# Patient Record
Sex: Male | Born: 1940 | Race: White | Hispanic: No | Marital: Married | Smoking: Former smoker
Health system: Southern US, Community
[De-identification: ages and names within clinical notes are randomized; demographics above are authoritative.]

## PROBLEM LIST (undated history)

## (undated) DIAGNOSIS — IMO0001 Reserved for inherently not codable concepts without codable children: Secondary | ICD-10-CM

## (undated) DIAGNOSIS — C3411 Malignant neoplasm of upper lobe, right bronchus or lung: Secondary | ICD-10-CM

## (undated) DIAGNOSIS — J189 Pneumonia, unspecified organism: Secondary | ICD-10-CM

## (undated) DIAGNOSIS — E7800 Pure hypercholesterolemia, unspecified: Secondary | ICD-10-CM

## (undated) DIAGNOSIS — I10 Essential (primary) hypertension: Secondary | ICD-10-CM

## (undated) HISTORY — DX: Malignant neoplasm of upper lobe, right bronchus or lung: C34.11

## (undated) HISTORY — PX: EYE SURGERY: SHX253

---

## 2004-11-01 ENCOUNTER — Ambulatory Visit: Payer: Self-pay | Admitting: Family Medicine

## 2004-12-27 ENCOUNTER — Ambulatory Visit: Payer: Self-pay | Admitting: Family Medicine

## 2005-05-03 ENCOUNTER — Ambulatory Visit: Payer: Self-pay | Admitting: Family Medicine

## 2005-09-07 ENCOUNTER — Ambulatory Visit: Payer: Self-pay | Admitting: Family Medicine

## 2005-09-28 ENCOUNTER — Ambulatory Visit: Payer: Self-pay | Admitting: Family Medicine

## 2005-10-11 ENCOUNTER — Ambulatory Visit: Payer: Self-pay | Admitting: Family Medicine

## 2005-12-11 ENCOUNTER — Ambulatory Visit: Payer: Self-pay | Admitting: Family Medicine

## 2006-03-19 ENCOUNTER — Ambulatory Visit: Payer: Self-pay | Admitting: Family Medicine

## 2006-07-23 ENCOUNTER — Ambulatory Visit: Payer: Self-pay | Admitting: Family Medicine

## 2006-12-05 ENCOUNTER — Ambulatory Visit: Payer: Self-pay | Admitting: Family Medicine

## 2008-09-29 ENCOUNTER — Ambulatory Visit (HOSPITAL_COMMUNITY): Admission: RE | Admit: 2008-09-29 | Discharge: 2008-09-29 | Payer: Self-pay | Admitting: Ophthalmology

## 2011-07-14 LAB — CBC
HCT: 50.1 % (ref 39.0–52.0)
Hemoglobin: 16.9 g/dL (ref 13.0–17.0)
MCV: 95.2 fL (ref 78.0–100.0)
WBC: 9.6 10*3/uL (ref 4.0–10.5)

## 2011-11-14 ENCOUNTER — Encounter (HOSPITAL_COMMUNITY): Payer: Self-pay | Admitting: Pharmacy Technician

## 2011-11-21 ENCOUNTER — Ambulatory Visit (HOSPITAL_COMMUNITY)
Admission: RE | Admit: 2011-11-21 | Discharge: 2011-11-21 | Disposition: A | Discharge status: Home / Self Care | Payer: Medicare Other | Source: Ambulatory Visit | Attending: Ophthalmology | Admitting: Ophthalmology

## 2011-11-21 ENCOUNTER — Encounter (HOSPITAL_COMMUNITY)
Admission: RE | Disposition: A | Discharge status: Home / Self Care | Payer: Self-pay | Source: Ambulatory Visit | Attending: Ophthalmology

## 2011-11-21 DIAGNOSIS — I10 Essential (primary) hypertension: Secondary | ICD-10-CM | POA: Insufficient documentation

## 2011-11-21 DIAGNOSIS — H26499 Other secondary cataract, unspecified eye: Secondary | ICD-10-CM | POA: Insufficient documentation

## 2011-11-21 DIAGNOSIS — E7800 Pure hypercholesterolemia, unspecified: Secondary | ICD-10-CM | POA: Insufficient documentation

## 2011-11-21 SURGERY — TREATMENT, USING YAG LASER
Anesthesia: LOCAL | Laterality: Right

## 2011-11-21 MED ORDER — CYCLOPENTOLATE-PHENYLEPHRINE 0.2-1 % OP SOLN
1.0000 [drp] | OPHTHALMIC | Status: AC
  Administered 2011-11-21 (×3): 1 [drp] via OPHTHALMIC

## 2011-11-21 MED ORDER — CYCLOPENTOLATE-PHENYLEPHRINE 0.2-1 % OP SOLN
OPHTHALMIC | Status: AC
  Administered 2011-11-21: 1 [drp] via OPHTHALMIC
  Filled 2011-11-21: qty 2

## 2011-11-21 NOTE — Discharge Instructions (Signed)
2 JAICION LAURIE  11/21/2011     Instructions    Activity: No Restrictions.   Diet: Resume Diet you were on at home.   Pain Medication: Tylenol if Needed.   CONTACT YOUR DOCTOR IF YOU HAVE PAIN, REDNESS IN YOUR EYE, OR DECREASED VISION.   Follow-up:today  Between 1 and 2 pm with Loraine Leriche T. Nile Riggs, MD.   Dr. Lahoma Crocker: [phone]  Dr. Lita Mains: [phone]  Dr. Alto Denver: [phone]   If you find that you cannot contact your physician, but feel that your signs and   Symptoms warrant a physician's attention, call the Emergency Room at   [phone] [phone].   Othern/a.

## 2011-11-21 NOTE — H&P (Signed)
The patient was re examined and there is no change in the patients condition since the original H and P. 

## 2011-11-21 NOTE — Brief Op Note (Signed)
Dustin Bradshaw 11/21/2011  [name] T. Nile Riggs, MD  Yag Laser Self Test Completedyes. Procedure: Posterior Capsulotomy, right eye.  Eye Protection Worn by Staff yes. Laser In Use Sign on Door yes.  Laser: Nd:YAG Spot Size: Fixed Burst Mode: III Power Setting: 3.4 mJ/burst  Number of shots: 19 Total energy delivered: 63.6 mJ  Patency of the peripheral iridotomy was confirmed visually.  The patient tolerated the procedure without difficulty. No complications were encountered.    The patient was discharged home with the instructions to continue all his current glaucoma medications, if any.   Patient instructed to go to office at 0100 for intraocular pressure check.  Patient verbalizes understanding of discharge instructions yes.

## 2014-03-08 ENCOUNTER — Emergency Department (HOSPITAL_COMMUNITY)
Admission: EM | Admit: 2014-03-08 | Discharge: 2014-03-08 | Disposition: A | Discharge status: Home / Self Care | Payer: Medicare HMO | Attending: Emergency Medicine | Admitting: Emergency Medicine

## 2014-03-08 ENCOUNTER — Emergency Department (HOSPITAL_COMMUNITY): Payer: Medicare HMO

## 2014-03-08 ENCOUNTER — Encounter (HOSPITAL_COMMUNITY): Payer: Self-pay | Admitting: Emergency Medicine

## 2014-03-08 DIAGNOSIS — E7800 Pure hypercholesterolemia, unspecified: Secondary | ICD-10-CM | POA: Insufficient documentation

## 2014-03-08 DIAGNOSIS — IMO0002 Reserved for concepts with insufficient information to code with codable children: Secondary | ICD-10-CM | POA: Insufficient documentation

## 2014-03-08 DIAGNOSIS — J40 Bronchitis, not specified as acute or chronic: Secondary | ICD-10-CM

## 2014-03-08 DIAGNOSIS — I10 Essential (primary) hypertension: Secondary | ICD-10-CM | POA: Insufficient documentation

## 2014-03-08 DIAGNOSIS — Z87891 Personal history of nicotine dependence: Secondary | ICD-10-CM | POA: Insufficient documentation

## 2014-03-08 HISTORY — DX: Pure hypercholesterolemia, unspecified: E78.00

## 2014-03-08 HISTORY — DX: Essential (primary) hypertension: I10

## 2014-03-08 MED ORDER — PREDNISONE 10 MG PO TABS: 40.0000 mg | ORAL_TABLET | Freq: Every day | ORAL | Status: DC

## 2014-03-08 MED ORDER — PREDNISONE 50 MG PO TABS
60.0000 mg | ORAL_TABLET | Freq: Once | ORAL | Status: AC
  Administered 2014-03-08: 60 mg via ORAL
  Filled 2014-03-08 (×2): qty 1

## 2014-03-08 MED ORDER — ALBUTEROL SULFATE HFA 108 (90 BASE) MCG/ACT IN AERS
2.0000 | INHALATION_SPRAY | Freq: Four times a day (QID) | RESPIRATORY_TRACT | Status: DC
  Administered 2014-03-08: 2 via RESPIRATORY_TRACT
  Filled 2014-03-08: qty 6.7

## 2014-03-08 MED ORDER — DM-GUAIFENESIN ER 30-600 MG PO TB12: 1.0000 | ORAL_TABLET | Freq: Two times a day (BID) | ORAL | Status: DC

## 2014-03-08 NOTE — Discharge Instructions (Signed)
Bronchitis Bronchitis is swelling (inflammation) of the air tubes leading to your lungs (bronchi). This causes mucus and a cough. If the swelling gets bad, you may have trouble breathing. HOME CARE   Rest.  Drink enough fluids to keep your pee (urine) clear or pale yellow (unless you have a condition where you have to watch how much you drink).  Only take medicine as told by your doctor. If you were given antibiotic medicines, finish them even if you start to feel better.  Avoid smoke, irritating chemicals, and strong smells. These make the problem worse. Quit smoking if you smoke. This helps your lungs heal faster.  Use a cool mist humidifier. Change the water in the humidifier every day. You can also sit in the bathroom with hot shower running for 5 10 minutes. Keep the door closed.  See your health care provider as told.  Wash your hands often. GET HELP IF: Your problems do not get better after 1 week. GET HELP RIGHT AWAY IF:   Your fever gets worse.  You have chills.  Your chest hurts.  Your problems breathing get worse.  You have blood in your mucus.  You pass out (faint).  You feel lightheaded.  You have a bad headache.  You throw up (vomit) again and again. MAKE SURE YOU:  Understand these instructions.  Will watch your condition.  Will get help right away if you are not doing well or get worse. Document Released: 03/13/2008 Document Revised: 07/16/2013 Document Reviewed: 05/20/2013 [facility] Patient Information 2014 [place], [place].  Use albuterol inhaler 2 puffs every 6 hours for the next 7 days. Take the Mucinex as directed for the next 7 days. Can continue that if necessary. Take prednisone for the next 5 days. Return for any new or worse symptoms. If after a week still not improved follow up with your regular Dr. Also recommend continuing the complete course of the doxycycline.

## 2014-03-08 NOTE — ED Provider Notes (Signed)
CSN: [id number]     Arrival date & time 03/08/14  1339 History   First MD Initiated Contact with Patient 03/08/14 1539     Chief Complaint  Patient presents with  . Cough     (Consider location/radiation/quality/duration/timing/severity/associated sxs/prior Treatment) Patient is a 73 y.o. male presenting with cough. The history is provided by the patient and the spouse.  Cough Associated symptoms: no chest pain, no fever, no headaches, no rash, no shortness of breath, no sore throat and no wheezing     patient with 2 week history of productive of green-colored sputum. And cough obviously. Also associated with some sinus congestion and pressure. Patient was seen in urgent care on Tuesday and diagnosed with bronchitis and sinusitis and started on doxycycline. Patient without any improvement. Patient is still a smoker still smokes occasionally. Patient denies fever. Patient denies any sniffing shortness of breath. No chest pain. Past Medical History  Diagnosis Date  . Hypertension   . High cholesterol    Past Surgical History  Procedure Laterality Date  . Eye surgery     No family history on file. History  Substance Use Topics  . Smoking status: Former Research scientist (life sciences)  . Smokeless tobacco: Not on file  . Alcohol Use: No    Review of Systems  Constitutional: Negative for fever.  HENT: Positive for congestion and sinus pressure. Negative for sore throat.   Eyes: Negative for visual disturbance.  Respiratory: Positive for cough. Negative for shortness of breath and wheezing.   Cardiovascular: Negative for chest pain.  Gastrointestinal: Negative for abdominal pain.  Genitourinary: Negative for dysuria.  Musculoskeletal: Negative for back pain.  Skin: Negative for rash.  Neurological: Negative for headaches.  Hematological: Does not bruise/bleed easily.  Psychiatric/Behavioral: Negative for confusion.      Allergies  Review of patient's allergies indicates no known allergies.  Home  Medications   Prior to Admission medications   Medication Sig Start Date End Date Taking? Authorizing Provider  amLODipine (NORVASC) 5 MG tablet Take 5 mg by mouth daily.    Historical Provider, MD  amLODipine-olmesartan (AZOR) 5-40 MG per tablet Take 1 tablet by mouth daily.    Historical Provider, MD  dextromethorphan-guaiFENesin (MUCINEX DM) 30-600 MG per 12 hr tablet Take 1 tablet by mouth 2 (two) times daily. 03/08/14   Fredia Sorrow, MD  metoprolol (TOPROL-XL) 200 MG 24 hr tablet Take 200 mg by mouth daily.    Historical Provider, MD  predniSONE (DELTASONE) 10 MG tablet Take 4 tablets (40 mg total) by mouth daily. 03/08/14   Fredia Sorrow, MD  rosuvastatin (CRESTOR) 20 MG tablet Take 20 mg by mouth at bedtime.    Historical Provider, MD   BP 155/94  Pulse 60  Temp(Src) 98.1 F (36.7 C) (Oral)  Resp 20  Ht 5\' 6"  (1.676 m)  Wt 202 lb (91.627 kg)  BMI 32.62 kg/m2  SpO2 96% Physical Exam  Nursing note and vitals reviewed. Constitutional: He is oriented to person, place, and time. He appears well-developed and well-nourished. No distress.  HENT:  Head: Normocephalic and atraumatic.  Mouth/Throat: Oropharynx is clear and moist.  Eyes: Conjunctivae are normal. Pupils are equal, round, and reactive to light.  Neck: Normal range of motion. Neck supple.  Cardiovascular: Normal rate, regular rhythm and normal heart sounds.   No murmur heard. Pulmonary/Chest: Effort normal. No respiratory distress. He has no wheezes. He has no rales.  Abdominal: Soft. Bowel sounds are normal. There is no tenderness.  Musculoskeletal: Normal range of  motion.  Neurological: He is alert and oriented to person, place, and time. No cranial nerve deficit. He exhibits normal muscle tone. Coordination normal.  Skin: Skin is warm. No rash noted.    ED Course  Procedures (including critical care time) Labs Review Labs Reviewed - No data to display  Imaging Review Dg Chest 2 View  03/08/2014   CLINICAL  DATA:  Cough and congestion  EXAM: CHEST  2 VIEW  COMPARISON:  03/03/2014  FINDINGS: The heart size and mediastinal contours are within normal limits. Both lungs are clear. The visualized skeletal structures are unremarkable.  IMPRESSION: No active cardiopulmonary disease.   Electronically Signed   By: Inez Catalina M.D.   On: 03/08/2014 14:39     EKG Interpretation None      MDM   Final diagnoses:  Bronchitis    Patient nontoxic no acute distress. No hypoxia. Symptoms most likely consistent with a bronchitis could be a component of COPD patient is a smoker as well could be a viral component. Because there is some sinus pressure. No distinct upper respiratory infection type symptoms early on though. We'll treat with albuterol inhaler 2 puffs every 6 hours prednisone for the next 5 days. Mucinex DM for the next 7 days. And have patient complete his course of doxycycline that started Tuesday from the urgent care. Today's chest x-ray is negative for pneumonia.    Fredia Sorrow, MD 03/08/14 [phone]

## 2014-03-08 NOTE — ED Notes (Signed)
Pt alert & oriented x4, stable gait. Patient given discharge instructions, paperwork & prescription(s). Patient  instructed to stop at the registration desk to finish any additional paperwork. Patient verbalized understanding. Pt left department w/ no further questions. 

## 2014-03-08 NOTE — ED Notes (Signed)
Pt c/o cough that is productive with green/cream sputum production that started two weeks ago, was seen at urgent care on Tuesday, dx with bronchitis and sinusitis, prescribed doxycycline but states that he is not getting any better,

## 2015-06-02 ENCOUNTER — Encounter (HOSPITAL_COMMUNITY): Payer: Self-pay | Admitting: Cardiology

## 2015-06-02 ENCOUNTER — Emergency Department (HOSPITAL_COMMUNITY): Payer: Medicare HMO

## 2015-06-02 ENCOUNTER — Emergency Department (HOSPITAL_COMMUNITY)
Admission: EM | Admit: 2015-06-02 | Discharge: 2015-06-02 | Disposition: A | Discharge status: Home / Self Care | Payer: Medicare HMO | Attending: Emergency Medicine | Admitting: Emergency Medicine

## 2015-06-02 DIAGNOSIS — Z79899 Other long term (current) drug therapy: Secondary | ICD-10-CM | POA: Diagnosis not present

## 2015-06-02 DIAGNOSIS — K567 Ileus, unspecified: Secondary | ICD-10-CM | POA: Diagnosis not present

## 2015-06-02 DIAGNOSIS — E780 Pure hypercholesterolemia: Secondary | ICD-10-CM | POA: Insufficient documentation

## 2015-06-02 DIAGNOSIS — J159 Unspecified bacterial pneumonia: Secondary | ICD-10-CM | POA: Insufficient documentation

## 2015-06-02 DIAGNOSIS — Z87891 Personal history of nicotine dependence: Secondary | ICD-10-CM | POA: Diagnosis not present

## 2015-06-02 DIAGNOSIS — I10 Essential (primary) hypertension: Secondary | ICD-10-CM | POA: Insufficient documentation

## 2015-06-02 DIAGNOSIS — J189 Pneumonia, unspecified organism: Secondary | ICD-10-CM

## 2015-06-02 DIAGNOSIS — R51 Headache: Secondary | ICD-10-CM | POA: Diagnosis present

## 2015-06-02 LAB — COMPREHENSIVE METABOLIC PANEL
ALBUMIN: 3.2 g/dL — AB (ref 3.5–5.0)
ALK PHOS: 89 U/L (ref 38–126)
ALT: 39 U/L (ref 17–63)
AST: 28 U/L (ref 15–41)
Anion gap: 8 (ref 5–15)
BILIRUBIN TOTAL: 1.2 mg/dL (ref 0.3–1.2)
BUN: 16 mg/dL (ref 6–20)
CALCIUM: 8.7 mg/dL — AB (ref 8.9–10.3)
CO2: 29 mmol/L (ref 22–32)
CREATININE: 1.13 mg/dL (ref 0.61–1.24)
Chloride: 104 mmol/L (ref 101–111)
GFR calc Af Amer: >60 mL/min (ref >60)
GFR calc non Af Amer: >60 mL/min (ref >60)
GLUCOSE: 108 mg/dL — AB (ref 65–99)
Potassium: 4.2 mmol/L (ref 3.5–5.1)
SODIUM: 141 mmol/L (ref 135–145)
TOTAL PROTEIN: 6.6 g/dL (ref 6.5–8.1)

## 2015-06-02 LAB — URINALYSIS, ROUTINE W REFLEX MICROSCOPIC
BILIRUBIN URINE: NEGATIVE
GLUCOSE, UA: NEGATIVE mg/dL
Ketones, ur: NEGATIVE mg/dL
Leukocytes, UA: NEGATIVE
Nitrite: NEGATIVE
PH: 6 (ref 5.0–8.0)
Protein, ur: 30 mg/dL — AB
SPECIFIC GRAVITY, URINE: 1.02 (ref 1.005–1.030)
Urobilinogen, UA: 0.2 mg/dL (ref 0.0–1.0)

## 2015-06-02 LAB — CBC WITH DIFFERENTIAL/PLATELET
BASOS PCT: 0 % (ref 0–1)
Basophils Absolute: 0 10*3/uL (ref 0.0–0.1)
EOS ABS: 0 10*3/uL (ref 0.0–0.7)
Eosinophils Relative: 0 % (ref 0–5)
HEMATOCRIT: 42.4 % (ref 39.0–52.0)
HEMOGLOBIN: 14.1 g/dL (ref 13.0–17.0)
LYMPHS ABS: 2.1 10*3/uL (ref 0.7–4.0)
Lymphocytes Relative: 13 % (ref 12–46)
MCH: 32.1 pg (ref 26.0–34.0)
MCHC: 33.3 g/dL (ref 30.0–36.0)
MCV: 96.6 fL (ref 78.0–100.0)
MONOS PCT: 10 % (ref 3–12)
Monocytes Absolute: 1.7 10*3/uL — ABNORMAL HIGH (ref 0.1–1.0)
NEUTROS ABS: 12.8 10*3/uL — AB (ref 1.7–7.7)
NEUTROS PCT: 77 % (ref 43–77)
Platelets: 283 10*3/uL (ref 150–400)
RBC: 4.39 MIL/uL (ref 4.22–5.81)
RDW: 13.4 % (ref 11.5–15.5)
WBC: 16.6 10*3/uL — AB (ref 4.0–10.5)

## 2015-06-02 LAB — URINE MICROSCOPIC-ADD ON

## 2015-06-02 LAB — LIPASE, BLOOD: Lipase: 22 U/L (ref 22–51)

## 2015-06-02 MED ORDER — IOHEXOL 300 MG/ML  SOLN
100.0000 mL | Freq: Once | INTRAMUSCULAR | Status: AC | PRN
  Administered 2015-06-02: 100 mL via INTRAVENOUS

## 2015-06-02 MED ORDER — IOHEXOL 300 MG/ML  SOLN
50.0000 mL | Freq: Once | INTRAMUSCULAR | Status: AC | PRN
  Administered 2015-06-02: 50 mL via ORAL

## 2015-06-02 MED ORDER — LEVOFLOXACIN 500 MG PO TABS: 500.0000 mg | ORAL_TABLET | Freq: Every day | ORAL | Status: DC

## 2015-06-02 MED ORDER — LEVOFLOXACIN 750 MG PO TABS
750.0000 mg | ORAL_TABLET | Freq: Once | ORAL | Status: AC
  Administered 2015-06-02: 750 mg via ORAL
  Filled 2015-06-02: qty 1

## 2015-06-02 NOTE — Discharge Instructions (Signed)
Small frequent meals. Tylenol as needed for fever. Levaquin as prescribed for 10 days. Follow-up with your primary care physician in 3-4 weeks for repeat x-ray to ensure that the changes noted on your x-ray today have completely resolved.  Ileus The intestine (bowel, or gut) is a long, muscular tube connecting your stomach to your rectum. If the intestine stops working, food cannot pass through. This is called an ileus. This can happen for a variety of reasons. Ileus is a major medical problem that usually requires hospitalization. If your intestine stops working because of a blockage, this is called a bowel obstruction and is a different condition. CAUSES   Surgery in your abdomen. This can last from a few hours to a few days.  An infection or inflammation in the belly (abdomen). This includes inflammation of the lining of the abdomen (peritonitis).  Infection or inflammation in other parts of the body, such as pneumonia or pancreatitis.  Passage of gallstones or kidney stones.  Damage to the nerves or blood vessels which go to the bowel.  Imbalance in the salts in the blood (electrolytes).  Injury to the brain and/or spinal cord.  Medications. Many medications can cause ileus or make it worse. The most common of these are strong pain medications. SYMPTOMS  Symptoms of bowel obstruction come from the bowel inactivity. They may include:  Bloating. Your belly gets bigger (distension).  Pain or discomfort in the abdomen.  Poor appetite, feeling sick to your stomach (nausea), and vomiting.  You may also not be able to hear your normal bowel sounds, such as "growling" in your stomach. DIAGNOSIS   Your history and a physical exam will usually suggest to your caregiver that you have an ileus.  X-rays or a CT scan of your abdomen will confirm the diagnosis. X-rays, CT scans, and lab tests may also suggest the cause. TREATMENT   Rest the intestine until it starts working again.  This is most often accomplished by:  Stopping intake of oral food and drink. Dehydration is prevented by using IV (intravenous) fluids.  Sometimes, a nasogastric tube (NG tube) is needed. This is a narrow plastic tube inserted through your nose and into your stomach. It is connected to suction to keep the stomach emptied out. This also helps treat the nausea and vomiting.  If there is an imbalance in the electrolytes, they are corrected with supplements in your intravenous fluids.  Medications that might make an ileus worse might be stopped.  There are no medications that reliably treat ileus, though your caregiver may suggest a trial of certain medications.  If your condition is slow to resolve, you will be reevaluated to be sure another condition, such as a blockage, is not present. Ileus is common and usually has a good outcome. Depending on the cause of your ileus, it usually can be treated by your caregivers with good results. Sometimes, specialists (surgeons or gastroenterologists) are asked to assist in your care.  HOME CARE INSTRUCTIONS   Follow your caregiver's instructions regarding diet and fluid intake. This will usually include drinking plenty of clear fluids, avoiding alcohol and caffeine, and eating a gentle diet.  Follow your caregiver's instructions regarding activity. A period of rest is sometimes advised before returning to work or school.  Take only medications prescribed by your caregiver. Be especially careful with narcotic pain medication, which can slow your bowel activity and contribute to ileus.  Keep any follow-up appointments with your caregiver or specialists. SEEK MEDICAL CARE IF:  You have a recurrence of nausea, vomiting, or abdominal discomfort.  You develop fever of more than 102 F (38.9 C). SEEK IMMEDIATE MEDICAL CARE IF:   You have severe abdominal pain.  You are unable to keep fluids down. Document Released: 09/28/2003 Document Revised:  02/09/2014 Document Reviewed: 01/28/2009 [facility] Patient Information 2015 [place], [place]. This information is not intended to replace advice given to you by your health care provider. Make sure you discuss any questions you have with your health care provider.  Pneumonia, Adult Pneumonia is an infection of the lungs. It may be caused by a germ (virus or bacteria). Some types of pneumonia can spread easily from person to person. This can happen when you cough or sneeze. HOME CARE  Only take medicine as told by your doctor.  Take your medicine (antibiotics) as told. Finish it even if you start to feel better.  Do not smoke.  You may use a vaporizer or humidifier in your room. This can help loosen thick spit (mucus).  Sleep so you are almost sitting up (semi-upright). This helps reduce coughing.  Rest. A shot (vaccine) can help prevent pneumonia. Shots are often advised for:  People over 61 years old.  Patients on chemotherapy.  People with long-term (chronic) lung problems.  People with immune system problems. GET HELP RIGHT AWAY IF:   You are getting worse.  You cannot control your cough, and you are losing sleep.  You cough up blood.  Your pain gets worse, even with medicine.  You have a fever.  Any of your problems are getting worse, not better.  You have shortness of breath or chest pain. MAKE SURE YOU:   Understand these instructions.  Will watch your condition.  Will get help right away if you are not doing well or get worse. Document Released: 03/13/2008 Document Revised: 12/18/2011 Document Reviewed: 12/16/2010 [facility] Patient Information 2015 [place], [place]. This information is not intended to replace advice given to you by your health care provider. Make sure you discuss any questions you have with your health care provider.

## 2015-06-02 NOTE — ED Provider Notes (Signed)
CSN: [id number]     Arrival date & time 06/02/15  1705 History   First MD Initiated Contact with Patient 06/02/15 1836     Chief Complaint  Patient presents with  . Headache      HPI  Patient presents for evaluation with essentially 2 complaints.   One: He states he was riding his lawnmower and struck his head against a tree limb. It knocked him backwards against the back edge of the seat but did not knock him from the mower. He is able to continue mowing and weed needed the rest of his yard. This is over a week ago. He continues with persistent headache since that time. No bleeding from ears nose or mouth. No bruising around the head or neck.  2: He had fevers shakes and chills Sunday that again yesterday. Does not have cough or sputum production. Area some pain across the shoulders. He feels like the last few months his abdomen has become bigger. He "refused whether it is distended or if he is "just putting on weight". He has normal bowel movements. He has normal urine output. He states he feels full very easily after eating.  Past Medical History  Diagnosis Date  . Hypertension   . High cholesterol    Past Surgical History  Procedure Laterality Date  . Eye surgery     History reviewed. No pertinent family history. Social History  Substance Use Topics  . Smoking status: Former Research scientist (life sciences)  . Smokeless tobacco: None  . Alcohol Use: No    Review of Systems  Constitutional: Positive for fever, chills and diaphoresis. Negative for appetite change and fatigue.  HENT: Negative for mouth sores, sore throat and trouble swallowing.   Eyes: Negative for visual disturbance.  Respiratory: Negative for cough, chest tightness, shortness of breath and wheezing.   Cardiovascular: Negative for chest pain.  Gastrointestinal: Positive for abdominal pain and abdominal distention. Negative for nausea, vomiting and diarrhea.  Endocrine: Negative for polydipsia, polyphagia and polyuria.    Genitourinary: Negative for dysuria, frequency and hematuria.  Musculoskeletal: Negative for gait problem.  Skin: Negative for color change, pallor and rash.  Neurological: Positive for headaches. Negative for dizziness, syncope and light-headedness.  Hematological: Does not bruise/bleed easily.  Psychiatric/Behavioral: Negative for behavioral problems and confusion.      Allergies  Review of patient's allergies indicates no known allergies.  Home Medications   Prior to Admission medications   Medication Sig Start Date End Date Taking? Authorizing Provider  amLODipine (NORVASC) 5 MG tablet Take 5 mg by mouth daily.    Historical Provider, MD  amLODipine-olmesartan (AZOR) 5-40 MG per tablet Take 1 tablet by mouth daily.    Historical Provider, MD  dextromethorphan-guaiFENesin (MUCINEX DM) 30-600 MG per 12 hr tablet Take 1 tablet by mouth 2 (two) times daily. 03/08/14   Fredia Sorrow, MD  levofloxacin (LEVAQUIN) 500 MG tablet Take 1 tablet (500 mg total) by mouth daily. 06/02/15   Tanna Furry, MD  metoprolol (TOPROL-XL) 200 MG 24 hr tablet Take 200 mg by mouth daily.    Historical Provider, MD  predniSONE (DELTASONE) 10 MG tablet Take 4 tablets (40 mg total) by mouth daily. 03/08/14   Fredia Sorrow, MD  rosuvastatin (CRESTOR) 20 MG tablet Take 20 mg by mouth at bedtime.    Historical Provider, MD   BP 128/86 mmHg  Pulse 67  Temp(Src) 98 F (36.7 C) (Oral)  Resp 20  Ht '5\' 6"'$  (1.676 m)  Wt 208 lb (94.348 kg)  BMI 33.59 kg/m2  SpO2 96% Physical Exam  Constitutional: He is oriented to person, place, and time. He appears well-developed and well-nourished. No distress.  HENT:  Head: Normocephalic. Head is without raccoon's eyes, without Battle's sign, without abrasion and without contusion. Hair is normal.  No sign of trauma to the head. No bruising or abrasions. No blood over the TMs, mastoids, or your nose or mouth. Nontender in the midline neck and spine.  Eyes: Conjunctivae are  normal. Pupils are equal, round, and reactive to light. No scleral icterus.  Neck: Normal range of motion. Neck supple. No thyromegaly present.  Cardiovascular: Normal rate and regular rhythm.  Exam reveals no gallop and no friction rub.   No murmur heard. Pulmonary/Chest: Effort normal and breath sounds normal. No respiratory distress. He has no wheezes. He has no rales.    Abdominal: Soft. Bowel sounds are normal. He exhibits no distension. There is no tenderness. There is no rebound.  Soft but protuberant abdomen. Normal active bowel sounds.  Musculoskeletal: Normal range of motion.  Neurological: He is alert and oriented to person, place, and time.  Skin: Skin is warm and dry. No rash noted.  Psychiatric: He has a normal mood and affect. His behavior is normal.    ED Course  Procedures (including critical care time) Labs Review Labs Reviewed  CBC WITH DIFFERENTIAL/PLATELET - Abnormal; Notable for the following:    WBC 16.6 (*)    Neutro Abs 12.8 (*)    Monocytes Absolute 1.7 (*)    All other components within normal limits  COMPREHENSIVE METABOLIC PANEL - Abnormal; Notable for the following:    Glucose, Bld 108 (*)    Calcium 8.7 (*)    Albumin 3.2 (*)    All other components within normal limits  URINALYSIS, ROUTINE W REFLEX MICROSCOPIC (NOT AT [facility]) - Abnormal; Notable for the following:    Hgb urine dipstick SMALL (*)    Protein, ur 30 (*)    All other components within normal limits  URINE MICROSCOPIC-ADD ON - Abnormal; Notable for the following:    Squamous Epithelial / LPF FEW (*)    All other components within normal limits  LIPASE, BLOOD    Imaging Review Dg Chest 2 View  06/02/2015   CLINICAL DATA:  Headache since last Thursday. Ran into a tree limb. Chills and abdominal bloating.  EXAM: CHEST  2 VIEW  COMPARISON:  03/08/2014  FINDINGS: Airspace disease demonstrated in the right upper lung most likely to represent pneumonia. Diffuse hyperinflation. Left lung is  clear. Heart size and pulmonary vascularity are normal. No pneumothorax. Degenerative changes in the spine.  IMPRESSION: Airspace disease in the right upper lung likely representing pneumonia. Followup PA and lateral chest X-ray is recommended in 3-4 weeks following trial of antibiotic therapy to ensure resolution and exclude underlying malignancy.   Electronically Signed   By: Lucienne Capers M.D.   On: 06/02/2015 20:36   Ct Head Wo Contrast  06/02/2015   CLINICAL DATA:  Struck head on a tree branch while riding a lawn mower week ago. Headache.  EXAM: CT HEAD WITHOUT CONTRAST  TECHNIQUE: Contiguous axial images were obtained from the base of the skull through the vertex without intravenous contrast.  COMPARISON:  None.  FINDINGS: There is mild age related volume loss. No evidence of old or acute focal infarction, mass lesion, hemorrhage, hydrocephalus or extra-axial collection. No skull fracture. No fluid in the sinuses, middle ears or mastoids.  IMPRESSION: No acute or traumatic  finding.  Mild generalized brain atrophy.   Electronically Signed   By: Nelson Chimes M.D.   On: 06/02/2015 20:28   Ct Abdomen Pelvis W Contrast  06/02/2015   CLINICAL DATA:  Abdominal distension, chills for couple of days  EXAM: CT ABDOMEN AND PELVIS WITH CONTRAST  TECHNIQUE: Multidetector CT imaging of the abdomen and pelvis was performed using the standard protocol following bolus administration of intravenous contrast.  CONTRAST:  16m OMNIPAQUE IOHEXOL 300 MG/ML SOLN, 1021mOMNIPAQUE IOHEXOL 300 MG/ML SOLN  COMPARISON:  None.  FINDINGS: Lower chest: Trace right pleural effusion. Lung bases are otherwise clear. Normal heart size.  Hepatobiliary: 5.9 x 7 cm hypodense, fluid attenuating right hepatic mass consistent with a cyst. Smaller 4.5 x 4.1 cm hypodense, fluid attenuating right hepatic mass most consistent with a cyst. 5.2 x 6.2 cm hypodense, fluid attenuating left hepatic mass most consistent with a cyst. Multiple other  hypodensities throughout the liver which are too small to characterize, but likely represent small cysts. Gallbladder is decompressed. No intrahepatic or extrahepatic biliary ductal dilatation.  Pancreas: Normal.  Spleen: Normal.  Adrenals/Urinary Tract: Normal adrenal glands. Multiple hypodense, fluid attenuating left renal mass is with the largest measuring 3.5 cm 9.9 x 8.3 cm in the lower pole most consistent with cysts. Two hypodense, fluid attenuating right inferior pole renal masses abutting each other measuring 3.6 x 2.9 cm and 2.9 x 3.7 cm respectively most consistent with cysts. There is a 5 mm calculus in the left bladder base adjacent to the UVJ. No obstructive uropathy.  Stomach/Bowel: No bowel wall thickening or dilatation. No pneumatosis, pneumoperitoneum or portal venous gas. No abdominal or pelvic free fluid.  Vascular/Lymphatic: Normal caliber abdominal aorta with atherosclerosis. No abdominal or pelvic lymphadenopathy.  Other: There is prostatic enlargement. No fluid collection or hematoma.  Musculoskeletal: No acute osseous abnormality. No aggressive lytic or sclerotic osseous lesion. Small sclerotic lesion in the right pubic symphysis likely reflecting a bone island. Bilateral facet arthropathy at L3-4, L4-5 and L5-S1.  IMPRESSION: 1. There is a 5 mm calculus in the left bladder base adjacent to the UVJ. No obstructive uropathy. 2. Multiple bilateral renal cysts. 3. Multiple hepatic cysts. 4. Trace right pleural effusion.   Electronically Signed   By: HeKathreen Devoid On: 06/02/2015 20:35   I have personally reviewed and evaluated these images and lab results as part of my medical decision-making.   EKG Interpretation None      MDM   Final diagnoses:  CAP (community acquired pneumonia)  Ileus    Head is atraumatic. Abdomen shows no acute findings. Has right upper lobe pneumonia. He likely has ileus secondary to his acute infection. He is tolerating by mouth here. Does not appear  acutely ill. Pain is Levaquin, small frequent meals. Stay hydrated. When necessary Tylenol. Primary care follow-up.    MaTanna FurryMD 06/02/15 2328

## 2015-06-02 NOTE — ED Notes (Signed)
Headache since last Thursday.  States he ran into a tree limb.  Also c/o chills.

## 2015-06-02 NOTE — ED Notes (Signed)
Discharge instructions given, pt demonstrated teach back and verbal understanding. No concerns voiced.  

## 2015-08-24 ENCOUNTER — Encounter (HOSPITAL_COMMUNITY)
Admission: RE | Disposition: A | Discharge status: Home / Self Care | Payer: Self-pay | Source: Ambulatory Visit | Attending: Ophthalmology

## 2015-08-24 ENCOUNTER — Ambulatory Visit (HOSPITAL_COMMUNITY)
Admission: RE | Admit: 2015-08-24 | Discharge: 2015-08-24 | Disposition: A | Discharge status: Home / Self Care | Payer: Medicare HMO | Source: Ambulatory Visit | Attending: Ophthalmology | Admitting: Ophthalmology

## 2015-08-24 DIAGNOSIS — I10 Essential (primary) hypertension: Secondary | ICD-10-CM | POA: Insufficient documentation

## 2015-08-24 DIAGNOSIS — H26492 Other secondary cataract, left eye: Secondary | ICD-10-CM | POA: Diagnosis present

## 2015-08-24 HISTORY — PX: YAG LASER APPLICATION: SHX6189

## 2015-08-24 SURGERY — TREATMENT, USING YAG LASER
Anesthesia: LOCAL | Laterality: Left

## 2015-08-24 MED ORDER — TROPICAMIDE 1 % OP SOLN
1.0000 [drp] | OPHTHALMIC | Status: AC
  Administered 2015-08-24 (×3): 1 [drp] via OPHTHALMIC

## 2015-08-24 MED ORDER — TROPICAMIDE 1 % OP SOLN
OPHTHALMIC | Status: AC
  Filled 2015-08-24: qty 3

## 2015-08-24 NOTE — Discharge Instructions (Signed)
[name]  08/24/2015     Instructions    Activity: No Restrictions.   Diet: Resume Diet you were on at home.   Pain Medication: Tylenol if Needed.   CONTACT YOUR DOCTOR IF YOU HAVE PAIN, REDNESS IN YOUR EYE, OR DECREASED VISION.   Follow-up:today with Rutherford Guys, MD.   Dr. Gershon Crane: [phone]  Dr. Iona Hansen: [phone]  Dr. Geoffry Paradise: [phone]   If you find that you cannot contact your physician, but feel that your signs and   Symptoms warrant a physician's attention, call the Emergency Room at   [phone].   Othern/a.

## 2015-08-24 NOTE — Op Note (Signed)
Dustin Bradshaw T. Gershon Crane, MD  Procedure: Yag Capsulotomy  Yag Laser Self Test Completedyes. Procedure: Posterior Capsulotomy, Eye Protection Worn by Staff yes. Laser In Use Sign on Door yes.  Laser: Nd:YAG Spot Size: Fixed Burst Mode: III Power Setting: 3.4 mJ/burst Number of shots: 16 Total energy delivered: 49.90 mJ   The patient tolerated the procedure without difficulty. No complications were encountered.   The patient was discharged home with the instructions to continue all her current glaucoma medications, if any.   Patient instructed to go to office at 0100 for intraocular pressure check.  Patient verbalizes understanding of discharge instructions Yes.  .    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

## 2015-08-24 NOTE — H&P (Signed)
The patient was re examined and there is no change in the patients condition since the original H and P. 

## 2015-08-25 ENCOUNTER — Encounter (HOSPITAL_COMMUNITY): Payer: Self-pay | Admitting: Ophthalmology

## 2015-09-10 ENCOUNTER — Ambulatory Visit (INDEPENDENT_AMBULATORY_CARE_PROVIDER_SITE_OTHER): Payer: Medicare HMO | Admitting: Emergency Medicine

## 2015-09-10 ENCOUNTER — Encounter: Payer: Self-pay | Admitting: Emergency Medicine

## 2015-09-10 ENCOUNTER — Other Ambulatory Visit: Payer: Self-pay | Admitting: *Deleted

## 2015-09-10 VITALS — BP 118/86 | HR 51 | Ht 66.0 in | Wt 216.0 lb

## 2015-09-10 DIAGNOSIS — R918 Other nonspecific abnormal finding of lung field: Secondary | ICD-10-CM

## 2015-09-10 DIAGNOSIS — R0600 Dyspnea, unspecified: Secondary | ICD-10-CM | POA: Diagnosis not present

## 2015-09-10 NOTE — Patient Instructions (Signed)
We will arrange for bronchoscopy to evaluate your airways Please stop your aspirin 2 days prior to the procedure.  Follow with Dr Lamonte Sakai next available to review results and continue the workup.

## 2015-09-10 NOTE — Assessment & Plan Note (Signed)
Suspect some component of COPD in addition to deconditioning and obesity. He will need pulmonary function testing in the future.

## 2015-09-10 NOTE — Assessment & Plan Note (Signed)
Right upper lobe mass with a suspected endobronchial lesion in the apical segment.  I believe he needs bronchoscopy as soon as we can arrange. We will hold his aspirin for 2 days, proceed with endobronchial biopsies.

## 2015-09-10 NOTE — H&P (Signed)
Subjective:    Patient ID: Dustin Bradshaw, male    DOB: 1941-08-06, 74 y.o.   MRN: [id number]  HPI 74 year old man, former smoker (30 pack years) with a history of hypertension and hyperlipidemia. He is referred by Dr Melina Copa for an abnormal CT chest from 08/13/15.  I have personally reviewed the results today, shows a suspected endobronchial lesion in the apical segment of the right upper lobe extending distally into a soft tissue mass with some associated segmental atx. Also noted is an 43m RLL nodule of unclear significance. [facility] in retrospect in absence of cough / sputum. His subsequent films did not resolve, prompting the CT scan.   At this time he feels better, he does have exertional SOB with stairs.    Review of Systems  Constitutional: Negative for fever and unexpected weight change.  HENT: Negative for congestion, dental problem, ear pain, nosebleeds, postnasal drip, rhinorrhea, sinus pressure, sneezing, sore throat and trouble swallowing.   Eyes: Negative for redness and itching.  Respiratory: Positive for shortness of breath. Negative for cough, chest tightness and wheezing.   Cardiovascular: Negative for palpitations and leg swelling.  Gastrointestinal: Negative for nausea and vomiting.       Acid Heartburn Indigestion  Genitourinary: Negative for dysuria.  Musculoskeletal: Negative for joint swelling.  Skin: Negative for rash.  Neurological: Negative for headaches.  Hematological: Does not bruise/bleed easily.  Psychiatric/Behavioral: Negative for dysphoric mood. The patient is not nervous/anxious.    Past Medical History  Diagnosis Date  . Hypertension   . High cholesterol      No family history on file.   Social History   Social History  . Marital Status: Married    Spouse Name: N/A  . Number of Children: N/A  . Years of Education: N/A   Occupational History  . Not on file.   Social History Main Topics  . Smoking status:  Former Smoker -- 0.50 packs/day for 60 years    Types: Cigarettes    Quit date: 03/10/2014  . Smokeless tobacco: Current User    Types: Chew  . Alcohol Use: No  . Drug Use: No  . Sexual Activity: Not Currently   Other Topics Concern  . Not on file   Social History Narrative     No Known Allergies   Outpatient Prescriptions Prior to Visit  Medication Sig Dispense Refill  . amLODipine (NORVASC) 10 MG tablet Take 1 tablet by mouth daily.    .Marland Kitchenaspirin 81 MG tablet Take 81 mg by mouth daily.    . hydrochlorothiazide (HYDRODIURIL) 25 MG tablet Take 1 tablet by mouth daily.    .Marland Kitchenlosartan (COZAAR) 100 MG tablet Take 1 tablet by mouth daily.    .Marland KitchenMEGARED OMEGA-3 KRILL OIL PO Take 1 tablet by mouth daily.    . metoprolol (TOPROL-XL) 200 MG 24 hr tablet Take 1 tablet by mouth daily.    . Multiple Vitamin (MULTIVITAMIN) tablet Take 1 tablet by mouth daily.    . simvastatin (ZOCOR) 40 MG tablet Take 1 tablet by mouth daily.    . metoprolol succinate (TOPROL-XL) 25 MG 24 hr tablet Take 25 mg by mouth daily.     No facility-administered medications prior to visit.         Objective:   Physical Exam Filed Vitals:   09/10/15 1458 09/10/15 1459  BP:  118/86  Pulse:  51  Height: '5\' 6"'$  (1.676 m)  Weight: 216 lb (97.977 kg)   SpO2:  95%   Gen: Pleasant, obese, in no distress,  normal affect  ENT: No lesions,  mouth clear,  oropharynx clear, no postnasal drip  Neck: No JVD, no stridor  Lungs: No use of accessory muscles, clear without rales or rhonchi, some R sided bronchial BS  Cardiovascular: RRR, heart sounds normal, no murmur or gallops, no peripheral edema  Musculoskeletal: No deformities, no cyanosis or clubbing  Neuro: alert, non focal  Skin: Warm, no lesions or rashes      Assessment & Plan:  Lung mass Right upper lobe mass with a suspected endobronchial lesion in the apical segment.  I believe he needs bronchoscopy as soon as we can arrange. We will hold his  aspirin for 2 days, proceed with endobronchial biopsies.   Dyspnea Suspect some component of COPD in addition to deconditioning and obesity. He will need pulmonary function testing in the future.

## 2015-09-13 ENCOUNTER — Telehealth: Payer: Self-pay | Admitting: Emergency Medicine

## 2015-09-13 NOTE — Telephone Encounter (Signed)
Pt has been scheduled for a bronch per RB's request. Bronch has been scheduled for 09/17/15 at 11:00am at [facility]. lmtcb x1 for pt.

## 2015-09-13 NOTE — Telephone Encounter (Signed)
Patient returned call, can be reached at [phone].

## 2015-09-13 NOTE — Telephone Encounter (Signed)
Called spoke with spouse. Aware of time/date below. Nothing further needed

## 2015-09-16 ENCOUNTER — Telehealth: Payer: Self-pay | Admitting: Emergency Medicine

## 2015-09-17 ENCOUNTER — Encounter (HOSPITAL_COMMUNITY)
Admission: RE | Disposition: A | Discharge status: Home / Self Care | Payer: Self-pay | Source: Ambulatory Visit | Attending: Emergency Medicine

## 2015-09-17 ENCOUNTER — Encounter (HOSPITAL_COMMUNITY): Payer: Self-pay | Admitting: Radiology

## 2015-09-17 ENCOUNTER — Ambulatory Visit (HOSPITAL_COMMUNITY)
Admission: RE | Admit: 2015-09-17 | Discharge: 2015-09-17 | Disposition: A | Discharge status: Home / Self Care | Payer: Medicare HMO | Source: Ambulatory Visit | Attending: Emergency Medicine | Admitting: Emergency Medicine

## 2015-09-17 ENCOUNTER — Inpatient Hospital Stay (HOSPITAL_COMMUNITY)
Admission: RE | Admit: 2015-09-17 | Discharge: 2015-09-17 | Disposition: A | Discharge status: Home / Self Care | Payer: Medicare HMO | Source: Ambulatory Visit

## 2015-09-17 DIAGNOSIS — Z7982 Long term (current) use of aspirin: Secondary | ICD-10-CM | POA: Insufficient documentation

## 2015-09-17 DIAGNOSIS — Z87891 Personal history of nicotine dependence: Secondary | ICD-10-CM | POA: Insufficient documentation

## 2015-09-17 DIAGNOSIS — I10 Essential (primary) hypertension: Secondary | ICD-10-CM | POA: Insufficient documentation

## 2015-09-17 DIAGNOSIS — C3411 Malignant neoplasm of upper lobe, right bronchus or lung: Secondary | ICD-10-CM | POA: Insufficient documentation

## 2015-09-17 DIAGNOSIS — E7800 Pure hypercholesterolemia, unspecified: Secondary | ICD-10-CM | POA: Insufficient documentation

## 2015-09-17 DIAGNOSIS — R918 Other nonspecific abnormal finding of lung field: Secondary | ICD-10-CM | POA: Diagnosis present

## 2015-09-17 DIAGNOSIS — Z79899 Other long term (current) drug therapy: Secondary | ICD-10-CM | POA: Diagnosis not present

## 2015-09-17 HISTORY — PX: VIDEO BRONCHOSCOPY: SHX5072

## 2015-09-17 SURGERY — BRONCHOSCOPY, WITH FLUOROSCOPY
Anesthesia: Moderate Sedation | Laterality: Bilateral

## 2015-09-17 MED ORDER — FENTANYL CITRATE (PF) 100 MCG/2ML IJ SOLN
INTRAMUSCULAR | Status: DC | PRN
  Administered 2015-09-17: 25 ug via INTRAVENOUS
  Administered 2015-09-17 (×2): 50 ug via INTRAVENOUS

## 2015-09-17 MED ORDER — MIDAZOLAM HCL 10 MG/2ML IJ SOLN
INTRAMUSCULAR | Status: DC | PRN
  Administered 2015-09-17: 1 mg via INTRAVENOUS
  Administered 2015-09-17 (×2): 2 mg via INTRAVENOUS
  Administered 2015-09-17: 3 mg via INTRAVENOUS
  Administered 2015-09-17: 2 mg via INTRAVENOUS

## 2015-09-17 MED ORDER — LIDOCAINE HCL 2 % EX GEL
CUTANEOUS | Status: DC | PRN
  Administered 2015-09-17: 1

## 2015-09-17 MED ORDER — FENTANYL CITRATE (PF) 100 MCG/2ML IJ SOLN
INTRAMUSCULAR | Status: AC
  Filled 2015-09-17: qty 4

## 2015-09-17 MED ORDER — LIDOCAINE HCL (PF) 1 % IJ SOLN
INTRAMUSCULAR | Status: DC | PRN
  Administered 2015-09-17: 5 mL

## 2015-09-17 MED ORDER — PHENYLEPHRINE HCL 0.5 % NA SOLN
NASAL | Status: DC | PRN
  Administered 2015-09-17: 2 [drp] via NASAL

## 2015-09-17 MED ORDER — SODIUM CHLORIDE 0.9 % IV SOLN
INTRAVENOUS | Status: DC
  Administered 2015-09-17: 11:00:00 via INTRAVENOUS

## 2015-09-17 MED ORDER — MIDAZOLAM HCL 5 MG/ML IJ SOLN
INTRAMUSCULAR | Status: AC
  Filled 2015-09-17: qty 2

## 2015-09-17 NOTE — Progress Notes (Signed)
Video bronchoscopy performed- biopsy intervention performed- brushing intervention performed- Pt tolerated procedure well.

## 2015-09-17 NOTE — Discharge Instructions (Signed)
Flexible Bronchoscopy, Care After These instructions give you information on caring for yourself after your procedure. Your doctor may also give you more specific instructions. Call your doctor if you have any problems or questions after your procedure. HOME CARE  Do not eat or drink anything for 2 hours after your procedure. If you try to eat or drink before the medicine wears off, food or drink could go into your lungs. You could also burn yourself.  After 2 hours have passed and when you can cough and gag normally, you may eat soft food and drink liquids slowly.  The day after the test, you may eat your normal diet.  You may do your normal activities.  Keep all doctor visits. GET HELP RIGHT AWAY IF:  You get more and more short of breath.  You get light-headed.  You feel like you are going to pass out (faint).  You have chest pain.  You have new problems that worry you.  You cough up more than a little blood.  You cough up more blood than before. MAKE SURE YOU:  Understand these instructions.  Will watch your condition.  Will get help right away if you are not doing well or get worse.   This information is not intended to replace advice given to you by your health care provider. Make sure you discuss any questions you have with your health care provider.   Document Released: 07/23/2009 Document Revised: 09/30/2013 Document Reviewed: 05/30/2013 Elsevier Interactive Patient Education [employer].  Please call our office for any questions or concerns. [phone].

## 2015-09-17 NOTE — Op Note (Signed)
Video Bronchoscopy Procedure Note  Date of Operation: 09/17/2015  Pre-op Diagnosis: RUL mass  Post-op Diagnosis: Same  Surgeon: Baltazar Apo  Assistants: none  Anesthesia: conscious sedation, moderate sedation  Meds Given: Patient's conscious sedation was initiated with serial doses versed and fentanyl but without effect after versed '9mg'$  + fentanyl 9mg given. Inspection of his R AC PIV revealed an infiltration. Patient did not get any of these doses IV, only in subcut tissue. A new IV was placed and he then received fentanyl 25 mcg, versed '2mg'$  in divided doses, 1% lidocaine 20cc total  Operation: Flexible video fiberoptic bronchoscopy and biopsies.  Estimated Blood Loss: 122QJ Complications: none noted  Indications and History: Dustin TOLENis 74y.o. with history of tobacco use, found to have a RUL mass on CT chest.  Recommendation was to perform video fiberoptic bronchoscopy with biopsies. The risks, benefits, complications, treatment options and expected outcomes were discussed with the patient.  The possibilities of pneumothorax, pneumonia, reaction to medication, pulmonary aspiration, perforation of a viscus, bleeding, failure to diagnose a condition and creating a complication requiring transfusion or operation were discussed with the patient who freely signed the consent.    Description of Procedure: The patient was seen in the Preoperative Area, was examined and was deemed appropriate to proceed.  [facility] as [facility] the procedure verified as Flexible Video Fiberoptic Bronchoscopy.  A Time Out was held and the above information confirmed.   Conscious sedation was initiated as indicated above. The video fiberoptic bronchoscope was introduced via the L nare and a general inspection was performed which showed crowded posterior pharynx but normal cords, normal trachea, normal main carina. The R sided airways were inspected and  showed a white friable lesion occluding the anterior segment of the RUL with some narrowing of the other segmental airways by extrinsic compression.  The RML and RLL airways were normal. The L side was then inspected. The LLL, Lingular and LUL airways were normal. Endobronchial brushings, endobronchial forceps biopsies were performed on the RUL endobronchial lesion. There was some initial moderate bleeding that stopped quickly. The patient tolerated the procedure well. The bronchoscope was removed. There were no obvious complications.   Samples: 1. Endobronchial brushings from RUL mass 2. Endobronchial forceps biopsies from RUL mass  Plans:  We will review the cytology, pathology results with the patient when they become available.  He will be watched closely in recovery to insure that he is waking adequately since he received subcutaneous sedation via the infiltrated IV. There was no hemodynamic or respiratory instability. Outpatient followup will be with Dr BLamonte Sakai    RBaltazar Apo MD, PhD 09/17/2015, 11:55 AM [facility] [phone] if no answer [phone]

## 2015-09-17 NOTE — Progress Notes (Signed)
Report given to endo RN

## 2015-09-20 ENCOUNTER — Encounter (HOSPITAL_COMMUNITY): Payer: Self-pay | Admitting: Emergency Medicine

## 2015-09-22 ENCOUNTER — Telehealth: Payer: Self-pay | Admitting: Emergency Medicine

## 2015-09-22 DIAGNOSIS — C3491 Malignant neoplasm of unspecified part of right bronchus or lung: Secondary | ICD-10-CM

## 2015-09-22 NOTE — Telephone Encounter (Signed)
Spoke with pt's wife, made her aware that he is not in clinic this week, but that I would forward the message to him to call pt's wife at the below number instead of the husband to discuss further details of pt's care.  Pt's wife states she is available all day tomorrow.    BQ please advise when this has been handled.  Thanks!

## 2015-09-22 NOTE — Telephone Encounter (Signed)
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 He is from [facility], wants to talk with his family regarding best Oncology practice to be referred. He will call us back with his decision.   When he calls, please go ahead and refer him to either the [place] [facility] in [place] or to the [place] at [facility] in [place], whichever he prefers. Thanks

## 2015-09-22 NOTE — Telephone Encounter (Signed)
[name], Cori Henningsen, called and would like to speak with Dr. Lamonte Sakai if possible. She will not be back today until around 4:30 pm, [phone].

## 2015-09-22 NOTE — Telephone Encounter (Signed)
Patient Returned call  [phone]

## 2015-09-22 NOTE — Telephone Encounter (Signed)
LVM for patient's wife to return call.

## 2015-09-24 NOTE — Telephone Encounter (Signed)
I called pt's wife this am, no answer. Left message. I will speak with her regarding onc referral, BQ no need  to call.

## 2015-09-24 NOTE — Telephone Encounter (Signed)
[name], Margaretha Sheffield, is calling to speak with Dr. Lamonte Sakai.  I advised her of his notes that he would try to reach her this pm.  She advised he should call her at [phone].

## 2015-09-24 NOTE — Telephone Encounter (Signed)
Spoke with Dustin Bradshaw regarding the diagnosis. She wanted recs about which Oncologist to seek out. I recommended that he have an initial visit at [facility] in [place] to allow input from all specialties, and that after that his treatment may be able to be coordinated with the cancer center in [place]. I will make the referral now.

## 2015-09-24 NOTE — Telephone Encounter (Signed)
I spoke with the patient. He wants me to talk to his wife, who is not available. I'll try them again this pm. Still haven't made the Onc referral yet

## 2015-09-27 ENCOUNTER — Telehealth: Payer: Self-pay | Admitting: *Deleted

## 2015-09-27 DIAGNOSIS — R918 Other nonspecific abnormal finding of lung field: Secondary | ICD-10-CM

## 2015-09-27 NOTE — Telephone Encounter (Signed)
Oncology Nurse Navigator Documentation  Oncology Nurse Navigator Flowsheets 09/27/2015  Referral date to RadOnc/MedOnc 09/27/2015  Navigator Encounter Type Introductory phone call/I received a referral today.  I called and scheduled.  Patient will be seen on 10/06/15 arrive at cancer center at 1:45.  He and wife verbalized understanding of appt time and place.   Patient Visit Type Initial  Treatment Phase Abnormal Scans  Interventions Coordination of Care  Coordination of Care MD Appointments  Time Spent with Patient 15

## 2015-10-01 ENCOUNTER — Telehealth: Payer: Self-pay | Admitting: Emergency Medicine

## 2015-10-01 MED ORDER — PREDNISONE 10 MG PO TABS: ORAL_TABLET | ORAL | Status: DC

## 2015-10-01 MED ORDER — AZITHROMYCIN 250 MG PO TABS: ORAL_TABLET | ORAL | Status: DC

## 2015-10-01 NOTE — Telephone Encounter (Signed)
We called in abx to [facility]- pt states it was suppose to be called into [facility] [phone]

## 2015-10-01 NOTE — Addendum Note (Signed)
Addended by: Desmond Dike C on: 10/01/2015 01:01 PM   Modules accepted: Orders

## 2015-10-01 NOTE — Telephone Encounter (Signed)
Spoke with pt's wife. States that the pt has a terrible chest cold. Reports chest congestion, coughing and wheezing. Cough is not producing mucus. Denies fever, SOB, chest tightness. Onset was 1 day ago. Would like something called in.  MW - please advise as RB is not available because he worked night shift last night.

## 2015-10-01 NOTE — Telephone Encounter (Signed)
azithromycin (ZITHROMAX) 250 MG tablet [[id number]]      Order Details    Dose, Route, Frequency: As Directed    Dispense Quantity:  6 tablet Refills:  0 Fills Remaining:  0          Sig: Take as directed         Written Date:  10/01/15 Expiration Date:  09/30/16     Start Date:  10/01/15 End Date:  --     Ordering Provider:  Tanda Rockers, MD Authorizing Provider:  Tanda Rockers, MD Ordering User:  Inge Rise, CMA                    Pharmacy:  [place], [place] and cancelled this order... ----------------------------------------------------------------------------------- Resubmitted order to [facility] per pt's request.  azithromycin (ZITHROMAX) 250 MG tablet [[id number]]      Order Details    Dose, Route, Frequency: As Directed    Dispense Quantity:  6 tablet Refills:  0 Fills Remaining:  0          Sig: Use as directed         Written Date:  10/01/15 Expiration Date:  09/30/16     Start Date:  10/01/15 End Date:  --     Ordering Provider:  Tanda Rockers, MD Authorizing Provider:  Tanda Rockers, MD Ordering User:  Glean Hess, CMA                    Pharmacy:  [facility] [address] [place] - [address]      Pharmacy Comments:  --           Disp Refills Start End       azithromycin (ZITHROMAX) 250 MG tablet 6 tablet 0 10/01/2015      Sig: Use as directed     E-Prescribing Status: Receipt confirmed by pharmacy (10/01/2015 12:19 PM EST     [name] notified that order has been cancelled at [facility] and resubmitted to [facility]. Nothing further needed.

## 2015-10-01 NOTE — Telephone Encounter (Signed)
Made in error

## 2015-10-01 NOTE — Telephone Encounter (Signed)
Zpak/ Prednisone 10 mg take  4 each am x 2 days,   2 each am x 2 days,  1 each am x 2 days and stop  Stop the fish oil when coughing

## 2015-10-01 NOTE — Telephone Encounter (Signed)
Glean Hess, CMA at 10/01/2015 12:20 PM     Status: Signed       Expand All Collapse All   azithromycin (ZITHROMAX) 250 MG tablet [[id number]]      Order Details    Dose, Route, Frequency: As Directed    Dispense Quantity: 6 tablet Refills: 0 Fills Remaining: 0          Sig: Take as directed         Written Date: 10/01/15 Expiration Date: 09/30/16     Start Date: 10/01/15 End Date: --     Ordering Provider: Tanda Rockers, MD Authorizing Provider: Tanda Rockers, MD Ordering User: Inge Rise, CMA                    Pharmacy: [place], [place] and cancelled this order... ----------------------------------------------------------------------------------- Resubmitted order to [facility] per pt's request.  azithromycin (ZITHROMAX) 250 MG tablet [[id number]]      Order Details    Dose, Route, Frequency: As Directed    Dispense Quantity: 6 tablet Refills: 0 Fills Remaining: 0          Sig: Use as directed         Written Date: 10/01/15 Expiration Date: 09/30/16     Start Date: 10/01/15 End Date: --     Ordering Provider: Tanda Rockers, MD Authorizing Provider: Tanda Rockers, MD Ordering User: Glean Hess, CMA                    Pharmacy: [facility] [address], [place] - [address]      Pharmacy Comments: --           Disp Refills Start End       azithromycin (ZITHROMAX) 250 MG tablet 6 tablet 0 10/01/2015      Sig: Use as directed     E-Prescribing Status: Receipt confirmed by pharmacy (10/01/2015 12:19 PM EST     [name] notified that order has been cancelled at [facility] and resubmitted to [facility]. Nothing further needed.

## 2015-10-01 NOTE — Telephone Encounter (Signed)
Called spoke with spouse and is aware of recs. RX's sent in. Nothing further needed

## 2015-10-06 ENCOUNTER — Other Ambulatory Visit (HOSPITAL_BASED_OUTPATIENT_CLINIC_OR_DEPARTMENT_OTHER): Payer: Medicare HMO

## 2015-10-06 ENCOUNTER — Encounter: Payer: Self-pay | Admitting: Internal Medicine

## 2015-10-06 ENCOUNTER — Telehealth: Payer: Self-pay | Admitting: Internal Medicine

## 2015-10-06 ENCOUNTER — Ambulatory Visit (HOSPITAL_BASED_OUTPATIENT_CLINIC_OR_DEPARTMENT_OTHER): Payer: Medicare HMO | Admitting: Internal Medicine

## 2015-10-06 VITALS — BP 132/69 | HR 62 | Temp 98.6°F | Resp 18 | Ht 66.0 in | Wt 217.5 lb

## 2015-10-06 DIAGNOSIS — C3411 Malignant neoplasm of upper lobe, right bronchus or lung: Secondary | ICD-10-CM | POA: Diagnosis not present

## 2015-10-06 DIAGNOSIS — R918 Other nonspecific abnormal finding of lung field: Secondary | ICD-10-CM

## 2015-10-06 HISTORY — DX: Malignant neoplasm of upper lobe, right bronchus or lung: C34.11

## 2015-10-06 LAB — COMPREHENSIVE METABOLIC PANEL
ALBUMIN: 3 g/dL — AB (ref 3.5–5.0)
ALT: 58 U/L — AB (ref 0–55)
ANION GAP: 10 meq/L (ref 3–11)
AST: 32 U/L (ref 5–34)
Alkaline Phosphatase: 94 U/L (ref 40–150)
BUN: 21.4 mg/dL (ref 7.0–26.0)
CALCIUM: 9.6 mg/dL (ref 8.4–10.4)
CHLORIDE: 105 meq/L (ref 98–109)
CO2: 26 mEq/L (ref 22–29)
Creatinine: 1.1 mg/dL (ref 0.7–1.3)
EGFR: 63 mL/min/{1.73_m2} — ABNORMAL LOW (ref >90)
Glucose: 112 mg/dl (ref 70–140)
POTASSIUM: 3.8 meq/L (ref 3.5–5.1)
Sodium: 142 mEq/L (ref 136–145)
TOTAL PROTEIN: 7.3 g/dL (ref 6.4–8.3)
Total Bilirubin: 1.12 mg/dL (ref 0.20–1.20)

## 2015-10-06 LAB — CBC WITH DIFFERENTIAL/PLATELET
BASO%: 0.2 % (ref 0.0–2.0)
Basophils Absolute: 0 10*3/uL (ref 0.0–0.1)
EOS ABS: 0.1 10*3/uL (ref 0.0–0.5)
EOS%: 0.7 % (ref 0.0–7.0)
HEMATOCRIT: 42.1 % (ref 38.4–49.9)
HGB: 14.2 g/dL (ref 13.0–17.1)
LYMPH#: 2.2 10*3/uL (ref 0.9–3.3)
LYMPH%: 19.3 % (ref 14.0–49.0)
MCH: 32.3 pg (ref 27.2–33.4)
MCHC: 33.7 g/dL (ref 32.0–36.0)
MCV: 95.7 fL (ref 79.3–98.0)
MONO#: 1.2 10*3/uL — AB (ref 0.1–0.9)
MONO%: 10.1 % (ref 0.0–14.0)
NEUT%: 69.7 % (ref 39.0–75.0)
NEUTROS ABS: 8.1 10*3/uL — AB (ref 1.5–6.5)
PLATELETS: 237 10*3/uL (ref 140–400)
RBC: 4.4 10*6/uL (ref 4.20–5.82)
RDW: 13.7 % (ref 11.0–14.6)
WBC: 11.6 10*3/uL — AB (ref 4.0–10.3)

## 2015-10-06 NOTE — Telephone Encounter (Signed)
Gave patient avs report and appointment for PFT 1/3. [facility] will call re pet/mri -patient aware. Per 12/28 Dustin Bradshaw to set up f/u 12/12 in [facility] - message sent to [facility].

## 2015-10-06 NOTE — Progress Notes (Signed)
[name]   [phone]  CONSULT NOTE  REFERRING PHYSICIAN: Dr. Baltazar Apo  REASON FOR CONSULTATION:  74 years old white male recently diagnosed with lung cancer.  HPI Dustin Bradshaw is a 74 y.o. male with long history of smoking and past medical history significant for hypertension and dyslipidemia. The patient mentions that in August 2016 he was complaining of shortness of breath and cough. He was diagnosed with pneumonia at that time and was treated with a course of antibiotics. Repeat chest x-ray at that time showed no improvement in his condition. He was treated with another course of antibiotics with no improvement. His primary care physician Dr. Melina Copa ordered CT scan of the chest which was performed on 08/13/2015 and it showed soft tissue filling defect within the apical segment branch of the right upper lobe bronchus extending out along the subsegmental branches. This results in a soft tissue mass measuring approximately 2.2 x 4.2 cm. There was mild associated postobstructive atelectasis anteriorly. There was also nonspecific 0.8 cm pulmonary nodule in the right lower lobe and 0.5 cm subpleural nodule evaluated with the left fissure. The findings were concerning for pulmonary carcinoid or primary bronchogenic neoplasm. There was no enlarged or abnormal mediastinal or hilar lymph nodes. The patient was referred to Dr. Lamonte Sakai and on 09/17/2015 he underwent a video bronchoscopy with brushings and biopsies of the right upper lobe lung mass. 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 There was insufficient material for additional testing. Dr. Lamonte Sakai kindly referred the patient to me today for further evaluation and recommendation regarding treatment of his condition. When seen today the patient has  shortness breath with exertion but no significant chest pain, cough or hemoptysis. He denied having any significant weight loss or night sweats. He has no nausea or vomiting. Has some headache and indigestion but no visual changes. Family history significant for father with melanoma and died at age 60 and mother died at age 55 from old age. He also has brother who died from stomach cancer at age 43. The patient is married and has 2 daughters. He was accompanied today by his wife, Dustin Bradshaw. He works on [employer]. He has a history of smoking 1 pack per day for around 60 years. He quit 2 years ago. He denied having any history of alcohol or drug abuse.  HPI  Past Medical History  Diagnosis Date  . Hypertension   . High cholesterol   . Primary cancer of right upper lobe of lung ([place]) 10/06/2015    Past Surgical History  Procedure Laterality Date  . Eye surgery    . Yag laser application Left 54/62/7035    Procedure: YAG LASER APPLICATION;  Surgeon: Rutherford Guys, MD;  Location: [facility];  Service: Ophthalmology;  Laterality: Left;  . Video bronchoscopy Bilateral 09/17/2015    Procedure: VIDEO BRONCHOSCOPY WITH FLUORO;  Surgeon: Collene Gobble, MD;  Location: [facility];  Service: Cardiopulmonary;  Laterality: Bilateral;    History reviewed. No pertinent family history.  Social History Social History  Substance Use Topics  . Smoking status: Former Smoker -- 0.50 packs/day for 60 years    Types: Cigarettes    Quit date: 03/10/2014  . Smokeless tobacco: Current User    Types: Chew  . Alcohol Use: No    No Known Allergies  Current Outpatient Prescriptions  Medication Sig Dispense Refill  . amLODipine (NORVASC) 10 MG tablet Take 1 tablet by mouth daily.    Marland Kitchen aspirin 81 MG tablet Take 81 mg by mouth daily.    Marland Kitchen losartan (COZAAR) 100 MG tablet Take 1 tablet by mouth daily.    Marland Kitchen MEGARED OMEGA-3 KRILL OIL PO Take 1 tablet by mouth daily.    . metoprolol (TOPROL-XL) 200 MG 24 hr tablet  Take 1 tablet by mouth daily.    . Multiple Vitamin (MULTIVITAMIN) tablet Take 1 tablet by mouth daily.    . predniSONE (DELTASONE) 10 MG tablet Take 4 tabs daily x 2 days, 2 tabs daily x 2 days, 1 tab daily x 2 days then stop 14 tablet 0  . simvastatin (ZOCOR) 40 MG tablet Take 1 tablet by mouth daily.     No current facility-administered medications for this visit.    Review of Systems  Constitutional: negative Eyes: negative Ears, nose, mouth, throat, and face: negative Respiratory: positive for dyspnea on exertion Cardiovascular: negative Gastrointestinal: negative Genitourinary:negative Integument/breast: negative Hematologic/lymphatic: negative Musculoskeletal:negative Neurological: negative Behavioral/Psych: negative Endocrine: negative Allergic/Immunologic: negative  Physical Exam  ZOX:WRUEA, healthy, no distress, well nourished and well developed SKIN: skin color, texture, turgor are normal, no rashes or significant lesions HEAD: Normocephalic, No masses, lesions, tenderness or abnormalities EYES: normal, PERRLA, Conjunctiva are pink and non-injected EARS: External ears normal, Canals clear OROPHARYNX:no exudate, no erythema and lips, buccal mucosa, and tongue normal  NECK: supple, no adenopathy, no JVD LYMPH:  no palpable lymphadenopathy, no hepatosplenomegaly LUNGS: clear to auscultation , and palpation HEART: regular rate & rhythm, no murmurs and no gallops ABDOMEN:abdomen soft, non-tender, normal bowel sounds and no masses or organomegaly BACK: Back symmetric, no curvature., No CVA tenderness EXTREMITIES:no joint deformities, effusion, or inflammation, no edema, no skin discoloration, no clubbing  NEURO: alert & oriented x 3 with fluent speech, no focal motor/sensory deficits  PERFORMANCE STATUS: ECOG 1  LABORATORY DATA: Lab Results  Component Value Date   WBC 11.6* 10/06/2015   HGB 14.2 10/06/2015   HCT 42.1 10/06/2015   MCV 95.7 10/06/2015   PLT 237  10/06/2015      Chemistry      Component Value Date/Time   NA 142 10/06/2015 1327   NA 141 06/02/2015 1913   K 3.8 10/06/2015 1327   K 4.2 06/02/2015 1913   CL 104 06/02/2015 1913   CO2 26 10/06/2015 1327   CO2 29 06/02/2015 1913   BUN 21.4 10/06/2015 1327   BUN 16 06/02/2015 1913   CREATININE 1.1 10/06/2015 1327   CREATININE 1.13 06/02/2015 1913      Component Value Date/Time   CALCIUM 9.6 10/06/2015 1327   CALCIUM 8.7* 06/02/2015 1913   ALKPHOS 94 10/06/2015 1327   ALKPHOS 89 06/02/2015 1913   AST 32 10/06/2015 1327   AST 28 06/02/2015 1913   ALT 58* 10/06/2015 1327   ALT 39 06/02/2015 1913   BILITOT 1.12 10/06/2015 1327   BILITOT 1.2 06/02/2015 1913       RADIOGRAPHIC STUDIES: No results found.  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  PLAN: I had a lengthy discussion with the patient and his wife today about his current disease stage, prognosis and treatment options. I recommended for the patient to complete the staging workup of his disease by ordering a PET scan  in addition to MRI of the brain. I will also order pulmonary function test before referring the patient to thoracic surgery for consideration of surgical resection if he has good pulmonary function test. If the patient is not a good surgical candidate for resection, he may benefit from a course of concurrent chemoradiation I will arrange for the patient to come back for follow-up visit in around 2 weeks to the multidisciplinary thoracic oncology clinic for further evaluation and more detailed discussion of his treatment options based on the final restaging workup. He was advised to call immediately if he has any concerning symptoms in the interval. The patient voices understanding of current disease status and treatment options and is in  agreement with the current care plan.  All questions were answered. The patient knows to call the clinic with any problems, questions or concerns. We can certainly see the patient much sooner if necessary.  Thank you so much for allowing me to participate in the care of Dustin Bradshaw. I will continue to follow up the patient with you and assist in his care.  I spent 40 minutes counseling the patient face to face. The total time spent in the appointment was 60 minutes.  Disclaimer: This note was dictated with voice recognition software. Similar sounding words can inadvertently be transcribed and may not be corrected upon review.   Berkleigh Beckles K. October 06, 2015, 2:58 PM

## 2015-10-12 ENCOUNTER — Telehealth: Payer: Self-pay | Admitting: *Deleted

## 2015-10-12 ENCOUNTER — Ambulatory Visit (HOSPITAL_COMMUNITY)
Admission: RE | Admit: 2015-10-12 | Discharge: 2015-10-12 | Disposition: A | Discharge status: Home / Self Care | Payer: PPO | Source: Ambulatory Visit | Attending: Internal Medicine | Admitting: Internal Medicine

## 2015-10-12 ENCOUNTER — Encounter: Payer: Self-pay | Admitting: *Deleted

## 2015-10-12 DIAGNOSIS — C3411 Malignant neoplasm of upper lobe, right bronchus or lung: Secondary | ICD-10-CM | POA: Diagnosis not present

## 2015-10-12 LAB — PULMONARY FUNCTION TEST
DL/VA % pred: 109 %
DL/VA: 4.73 ml/min/mmHg/L
DLCO COR % PRED: 59 %
DLCO cor: 15.99 ml/min/mmHg
DLCO unc % pred: 58 %
DLCO unc: 15.81 ml/min/mmHg
FEF 25-75 POST: 1.65 L/s
FEF 25-75 Pre: 1.16 L/sec
FEF2575-%CHANGE-POST: 42 %
FEF2575-%PRED-POST: 90 %
FEF2575-%Pred-Pre: 63 %
FEV1-%CHANGE-POST: 11 %
FEV1-%Pred-Post: 77 %
FEV1-%Pred-Pre: 69 %
FEV1-POST: 1.97 L
FEV1-Pre: 1.77 L
FEV1FVC-%CHANGE-POST: 0 %
FEV1FVC-%Pred-Pre: 94 %
FEV6-%Change-Post: 10 %
FEV6-%PRED-POST: 86 %
FEV6-%Pred-Pre: 78 %
FEV6-PRE: 2.59 L
FEV6-Post: 2.88 L
FEV6FVC-%Change-Post: 0 %
FEV6FVC-%PRED-PRE: 107 %
FEV6FVC-%Pred-Post: 106 %
FVC-%CHANGE-POST: 11 %
FVC-%PRED-POST: 81 %
FVC-%PRED-PRE: 73 %
FVC-POST: 2.91 L
FVC-PRE: 2.6 L
POST FEV1/FVC RATIO: 68 %
PRE FEV6/FVC RATIO: 100 %
Post FEV6/FVC ratio: 99 %
Pre FEV1/FVC ratio: 68 %
RV % PRED: 105 %
RV: 2.47 L
TLC % PRED: 83 %
TLC: 5.21 L

## 2015-10-12 MED ORDER — ALBUTEROL SULFATE (2.5 MG/3ML) 0.083% IN NEBU
2.5000 mg | INHALATION_SOLUTION | Freq: Once | RESPIRATORY_TRACT | Status: AC
  Administered 2015-10-12: 2.5 mg via RESPIRATORY_TRACT

## 2015-10-12 NOTE — Telephone Encounter (Signed)
Oncology Nurse Navigator Documentation  Oncology Nurse Navigator Flowsheets 10/12/2015  Navigator Encounter Type Telephone/per Dr. Worthy Flank request, I called and scheduled a follow up appt on 10/25/15 arrive at 1:45 with labs and follow up with Dr. Julien Nordmann  Patient Visit Type Follow-up  Treatment Phase Abnormal Scans  Interventions Coordination of Care  Coordination of Care MD Appointments  Time Spent with Patient 15

## 2015-10-18 ENCOUNTER — Inpatient Hospital Stay: Payer: Medicare HMO | Admitting: Emergency Medicine

## 2015-10-22 ENCOUNTER — Ambulatory Visit (HOSPITAL_COMMUNITY)
Admission: RE | Admit: 2015-10-22 | Discharge: 2015-10-22 | Disposition: A | Discharge status: Home / Self Care | Payer: PPO | Source: Ambulatory Visit | Attending: Internal Medicine | Admitting: Internal Medicine

## 2015-10-22 DIAGNOSIS — R918 Other nonspecific abnormal finding of lung field: Secondary | ICD-10-CM | POA: Diagnosis not present

## 2015-10-22 DIAGNOSIS — I289 Disease of pulmonary vessels, unspecified: Secondary | ICD-10-CM | POA: Diagnosis not present

## 2015-10-22 DIAGNOSIS — N400 Enlarged prostate without lower urinary tract symptoms: Secondary | ICD-10-CM | POA: Insufficient documentation

## 2015-10-22 DIAGNOSIS — I2510 Atherosclerotic heart disease of native coronary artery without angina pectoris: Secondary | ICD-10-CM | POA: Diagnosis not present

## 2015-10-22 DIAGNOSIS — C3411 Malignant neoplasm of upper lobe, right bronchus or lung: Secondary | ICD-10-CM | POA: Diagnosis not present

## 2015-10-22 DIAGNOSIS — J3489 Other specified disorders of nose and nasal sinuses: Secondary | ICD-10-CM | POA: Diagnosis not present

## 2015-10-22 DIAGNOSIS — R930 Abnormal findings on diagnostic imaging of skull and head, not elsewhere classified: Secondary | ICD-10-CM | POA: Diagnosis not present

## 2015-10-22 DIAGNOSIS — I517 Cardiomegaly: Secondary | ICD-10-CM | POA: Diagnosis not present

## 2015-10-22 LAB — GLUCOSE, CAPILLARY: GLUCOSE-CAPILLARY: 103 mg/dL — AB (ref 65–99)

## 2015-10-22 MED ORDER — GADOBENATE DIMEGLUMINE 529 MG/ML IV SOLN
20.0000 mL | Freq: Once | INTRAVENOUS | Status: AC | PRN
  Administered 2015-10-22: 20 mL via INTRAVENOUS

## 2015-10-22 MED ORDER — FLUDEOXYGLUCOSE F - 18 (FDG) INJECTION
9.7500 | Freq: Once | INTRAVENOUS | Status: AC | PRN
  Administered 2015-10-22: 9.75 via INTRAVENOUS

## 2015-10-25 ENCOUNTER — Other Ambulatory Visit: Payer: PPO

## 2015-10-25 ENCOUNTER — Ambulatory Visit (HOSPITAL_BASED_OUTPATIENT_CLINIC_OR_DEPARTMENT_OTHER): Payer: PPO | Admitting: Internal Medicine

## 2015-10-25 ENCOUNTER — Encounter: Payer: Self-pay | Admitting: Internal Medicine

## 2015-10-25 VITALS — BP 146/79 | HR 57 | Temp 98.3°F | Resp 18 | Ht 66.0 in | Wt 214.7 lb

## 2015-10-25 DIAGNOSIS — R0609 Other forms of dyspnea: Secondary | ICD-10-CM | POA: Diagnosis not present

## 2015-10-25 DIAGNOSIS — E784 Other hyperlipidemia: Secondary | ICD-10-CM | POA: Diagnosis not present

## 2015-10-25 DIAGNOSIS — I10 Essential (primary) hypertension: Secondary | ICD-10-CM | POA: Diagnosis not present

## 2015-10-25 DIAGNOSIS — C3411 Malignant neoplasm of upper lobe, right bronchus or lung: Secondary | ICD-10-CM | POA: Diagnosis not present

## 2015-10-25 DIAGNOSIS — E669 Obesity, unspecified: Secondary | ICD-10-CM | POA: Diagnosis not present

## 2015-10-25 DIAGNOSIS — N529 Male erectile dysfunction, unspecified: Secondary | ICD-10-CM | POA: Diagnosis not present

## 2015-10-25 DIAGNOSIS — Z79899 Other long term (current) drug therapy: Secondary | ICD-10-CM | POA: Diagnosis not present

## 2015-10-25 NOTE — Progress Notes (Signed)
[facility] Telephone:([phone]) [phone]   Fax:(336) [phone]  OFFICE PROGRESS NOTE  Dustin Graves, DO [address] [place] [address]  DIAGNOSIS: Stage IB (T2a, N0, M0) non-small cell lung cancer, squamous cell carcinoma diagnosed in December 2016 and presented with central right upper lobe lung mass.  PRIOR THERAPY: None  CURRENT THERAPY: None  INTERVAL HISTORY: Dustin Bradshaw 75 y.o. male returns to the clinic today for follow-up visit accompanied by his wife. The patient is feeling fine today with no specific complaints except for mild shortness of breath only on exertion when he goes upstairs. He denied having any chest pain, cough or hemoptysis. He has no significant weight loss or night sweats. He has no nausea or vomiting. He has no fever or chills. The patient had several studies performed recently including a PET scan in addition to MRI of the brain as well as pulmonary function test and he is here for reevaluation and discussion of his treatment options.  MEDICAL HISTORY: Past Medical History  Diagnosis Date  . Hypertension   . High cholesterol   . Primary cancer of right upper lobe of lung (Dustin Bradshaw) 10/06/2015    ALLERGIES:  has No Known Allergies.  MEDICATIONS:  Current Outpatient Prescriptions  Medication Sig Dispense Refill  . amLODipine (NORVASC) 10 MG tablet Take 1 tablet by mouth daily.    Marland Kitchen aspirin 81 MG tablet Take 81 mg by mouth daily.    Marland Kitchen losartan (COZAAR) 100 MG tablet Take 1 tablet by mouth daily.    Marland Kitchen MEGARED OMEGA-3 KRILL OIL PO Take 1 tablet by mouth daily.    . metoprolol (TOPROL-XL) 200 MG 24 hr tablet Take 1 tablet by mouth daily.    . Multiple Vitamin (MULTIVITAMIN) tablet Take 1 tablet by mouth daily.    . simvastatin (ZOCOR) 40 MG tablet Take 1 tablet by mouth daily.     No current facility-administered medications for this visit.    SURGICAL HISTORY:  Past Surgical History  Procedure Laterality Date  . Eye surgery    .  Yag laser application Left 70/48/8891    Procedure: YAG LASER APPLICATION;  Surgeon: Rutherford Guys, MD;  Location: [facility];  Service: Ophthalmology;  Laterality: Left;  . Video bronchoscopy Bilateral 09/17/2015    Procedure: VIDEO BRONCHOSCOPY WITH FLUORO;  Surgeon: Collene Gobble, MD;  Location: [facility];  Service: Cardiopulmonary;  Laterality: Bilateral;    REVIEW OF SYSTEMS:  Constitutional: negative Eyes: negative Ears, nose, mouth, throat, and face: negative Respiratory: positive for dyspnea on exertion Cardiovascular: negative Gastrointestinal: negative Genitourinary:negative Integument/breast: negative Hematologic/lymphatic: negative Musculoskeletal:negative Neurological: negative Behavioral/Psych: negative Endocrine: negative Allergic/Immunologic: negative   PHYSICAL EXAMINATION: General appearance: alert, cooperative and no distress Head: Normocephalic, without obvious abnormality, atraumatic Neck: no adenopathy, no JVD, supple, symmetrical, trachea midline and thyroid not enlarged, symmetric, no tenderness/mass/nodules Lymph nodes: Cervical, supraclavicular, and axillary nodes normal. Resp: clear to auscultation bilaterally Back: symmetric, no curvature. ROM normal. No CVA tenderness. Cardio: regular rate and rhythm, S1, S2 normal, no murmur, click, rub or gallop GI: soft, non-tender; bowel sounds normal; no masses,  no organomegaly Extremities: extremities normal, atraumatic, no cyanosis or edema Neurologic: Alert and oriented X 3, normal strength and tone. Normal symmetric reflexes. Normal coordination and gait  ECOG PERFORMANCE STATUS: 1 - Symptomatic but completely ambulatory  Blood pressure 146/79, pulse 57, temperature 98.3 F (36.8 C), temperature source Oral, resp. rate 18, height '5\' 6"'$  (1.676 m), weight 214 lb 11.2  oz (97.387 kg), SpO2 96 %.  LABORATORY DATA: Lab Results  Component Value Date   WBC 11.6* 10/06/2015   HGB 14.2 10/06/2015   HCT 42.1  10/06/2015   MCV 95.7 10/06/2015   PLT 237 10/06/2015      Chemistry      Component Value Date/Time   NA 142 10/06/2015 1327   NA 141 06/02/2015 1913   K 3.8 10/06/2015 1327   K 4.2 06/02/2015 1913   CL 104 06/02/2015 1913   CO2 26 10/06/2015 1327   CO2 29 06/02/2015 1913   BUN 21.4 10/06/2015 1327   BUN 16 06/02/2015 1913   CREATININE 1.1 10/06/2015 1327   CREATININE 1.13 06/02/2015 1913      Component Value Date/Time   CALCIUM 9.6 10/06/2015 1327   CALCIUM 8.7* 06/02/2015 1913   ALKPHOS 94 10/06/2015 1327   ALKPHOS 89 06/02/2015 1913   AST 32 10/06/2015 1327   AST 28 06/02/2015 1913   ALT 58* 10/06/2015 1327   ALT 39 06/02/2015 1913   BILITOT 1.12 10/06/2015 1327   BILITOT 1.2 06/02/2015 1913       RADIOGRAPHIC STUDIES: Mr Jeri Cos Wo Contrast  11-07-15  CLINICAL DATA:  Initial treatment strategy for staging of right upper lobe lung mass. EXAM: MRI HEAD WITHOUT AND WITH CONTRAST TECHNIQUE: Multiplanar, multiecho pulse sequences of the brain and surrounding structures were obtained without and with intravenous contrast. CONTRAST:  66m MULTIHANCE GADOBENATE DIMEGLUMINE 529 MG/ML IV SOLN COMPARISON:  CT head without contrast 06/02/2015. FINDINGS: Mild atrophy and white matter changes are present. A a persistent cavum septum pellucidum is again noted. White matter changes extend into the brainstem. No acute infarct, hemorrhage, or mass lesion is present. No pathologic enhancement is evident to suggest metastatic disease of the brain or meninges. The internal auditory canals are within normal limits bilaterally. Flow is present in the major intracranial arteries. Bilateral lens replacements are present. The globes and orbits are otherwise intact. Mild mucosal thickening is present in the maxillary sinuses bilaterally. The remaining paranasal sinuses and the mastoid air cells are clear. IMPRESSION: 1. No evidence for metastatic disease the brain. 2. Mild periventricular and  subcortical white matter changes bilaterally likely reflect the sequela of chronic microvascular ischemia. 3. Minimal maxillary sinus disease bilaterally. Electronically Signed   By: CSan MorelleM.D.   On: 001/29/201714:13   Nm Pet Image Initial (pi) Skull Base To Thigh  101-29-2017 CLINICAL DATA:  Initial treatment strategy for staging of right upper lobe lung mass. EXAM: NUCLEAR MEDICINE PET SKULL BASE TO THIGH TECHNIQUE: 9.8 mCi F-18 FDG was injected intravenously. Full-ring PET imaging was performed from the skull base to thigh after the radiotracer. CT data was obtained and used for attenuation correction and anatomic localization. FASTING BLOOD GLUCOSE:  Value: 103 mg/dl COMPARISON:  Chest CT of 08/12/2015. Abdominal pelvic CT 06/02/2015. FINDINGS: NECK No areas of abnormal hypermetabolism. CHEST The central right upper lobe lung mass has is enlarged, and is hypermetabolic. This measures 4.1 x 3.0 cm and a S.U.V. max of 16.2 on image 23/series 8. This is centered within the posterior segmental bronchus. Soft tissue density extending into the apical segmental bronchus, with surrounding atelectasis, demonstrates lower level hypermetabolism, including at a S.U.V. max of 3.8 on image 17/series 8. No nodal hypermetabolism within the chest. ABDOMEN/PELVIS Both adrenal glands demonstrate moderate hypermetabolism, slightly less than liver. For example, the left adrenal measures a S.U.V. max of 4.2. minimal low-density left adrenal nodularity, favoring an  adenoma. no other abnormal abdominal hypermetabolism. SKELETON No abnormal marrow activity. CT IMAGES PERFORMED FOR ATTENUATION CORRECTION No cervical adenopathy. Mild cardiomegaly, without pericardial effusion. Right coronary artery atherosclerosis. Pulmonary artery enlargement, outflow tract 3.2 cm. Development of reticular nodular opacities throughout the right upper lobe, likely due to postobstructive pneumonitis. Large hepatic cysts. Fluid density  renal lesions which are also likely cysts. Abdominal aortic and branch vessel atherosclerosis. Apparent hepatic flexure colonic wall thickening, favored to be due to underdistention. Moderate prostatomegaly. Probable bone island in the right pubis. IMPRESSION: 1. Interval enlargement of a mass centered within the posterior right upper lobe segmental bronchus, favored to represent primary bronchogenic carcinoma. 2. Soft tissue density within the right apical segmental bronchus demonstrates lower level hypermetabolism. This is suspicious for tumor extension. A portion of this could be due to bronchial obstruction and mucous impaction. 3. No thoracic nodal or extra thoracic hypermetabolic metastatic disease. Bilateral adrenal hypermetabolism is without dominant mass and favored to be physiologic. 4. Development of right upper lobe reticular nodular opacities, most consistent with postobstructive pneumonitis. 5.  Atherosclerosis, including within the coronary arteries. 6. Pulmonary artery enlargement suggests pulmonary arterial hypertension. 7. Prostatomegaly. Electronically Signed   By: Abigail Miyamoto M.D.   On: 10/22/2015 12:04    ASSESSMENT AND PLAN: This is a very pleasant 74 years old white male with recently diagnosed as stage IB non-small cell lung cancer, squamous cell carcinoma and presented with central right upper lobe lung mass. The recent PET scan showed no evidence for metastatic disease to the mediastinum or distant metastasis. His MRI of the brain also showed no evidence of metastatic disease to the brain. The patient also has pulmonary function test. He is probably a borderline resectable. I recommended for the patient to see cardiothoracic surgery for evaluation and to make a final decision regarding his surgical option. If the patient is not a good surgical candidate, he would probably benefit from a course of curative radiotherapy plus/minus concurrent chemotherapy. I contacted the  cardiothoracic surgery orifice to arrange an appointment for the patient. I will see him back for follow-up visit after his surgical evaluation and we will call the patient with an appointment once we hear from surgery regarding his surgical options. The patient was advised to call immediately if he has any concerning symptoms in the interval. The patient voices understanding of current disease status and treatment options and is in agreement with the current care plan.  All questions were answered. The patient knows to call the clinic with any problems, questions or concerns. We can certainly see the patient much sooner if necessary.  I spent 15 minutes counseling the patient face to face. The total time spent in the appointment was 25 minutes.  Disclaimer: This note was dictated with voice recognition software. Similar sounding words can inadvertently be transcribed and may not be corrected upon review.

## 2015-10-29 ENCOUNTER — Other Ambulatory Visit: Payer: Self-pay | Admitting: *Deleted

## 2015-10-29 ENCOUNTER — Institutional Professional Consult (permissible substitution) (INDEPENDENT_AMBULATORY_CARE_PROVIDER_SITE_OTHER): Payer: PPO | Admitting: Cardiothoracic Surgery

## 2015-10-29 ENCOUNTER — Encounter: Payer: Self-pay | Admitting: Cardiothoracic Surgery

## 2015-10-29 VITALS — BP 131/83 | HR 50 | Resp 18 | Ht 66.0 in | Wt 212.0 lb

## 2015-10-29 DIAGNOSIS — R918 Other nonspecific abnormal finding of lung field: Secondary | ICD-10-CM | POA: Diagnosis not present

## 2015-10-29 DIAGNOSIS — Z01810 Encounter for preprocedural cardiovascular examination: Secondary | ICD-10-CM

## 2015-10-29 NOTE — Progress Notes (Signed)
PCP is [name], DO Referring Provider is Curt Bears, MD  Chief Complaint  Patient presents with  . Lung Mass    RULobe...CT CHEST 08/12/15, PET/MRI BRAIN 10/22/15.Marland KitchenMarland KitchenPFT 10/12/15  patient examined, CT scan, PET scan, pathology report, bronchoscopy note and  PFTs all personally reviewed and counseled with patient.  HPI:the patient is 75 years old with previous smoking history, stopped over 2 years ago. After symptoms of upper SPARC or infection chest x-ray and a chest CT scan were performed and a 4 cm right upper lobe mass was noted. Transbronchial biopsies by Dr. Lamonte Sakai were positive for squamous cell cancer. PET scan shows hypermetabolic activity of the tumor but no activity the mediastinal nodes or distal sites. Brain MRI was negative. The patient presents for surgical evaluation and possible resection.  The patient's FEV1 is 1.77 FVC 2.6 and DLCO 59%.  The patient has risk factors for heart disease including hypertension, smoking, and hyperlipidemia. He has some calcification of his coronaries noted on the CT scan. He denies symptoms of angina or CHF but he will need cardiology clearance with a Cardiolite scan and 8 echocardiogram prior to scheduling lobectomy.  The patient's symptoms of URI have significantly improved.   Past Medical History  Diagnosis Date  . Hypertension   . High cholesterol   . Primary cancer of right upper lobe of lung ([facility]) 10/06/2015    Past Surgical History  Procedure Laterality Date  . Eye surgery    . Yag laser application Left 16/07/9603    Procedure: YAG LASER APPLICATION;  Surgeon: Rutherford Guys, MD;  Location: [facility];  Service: Ophthalmology;  Laterality: Left;  . Video bronchoscopy Bilateral 09/17/2015    Procedure: VIDEO BRONCHOSCOPY WITH FLUORO;  Surgeon: Collene Gobble, MD;  Location: [facility];  Service: Cardiopulmonary;  Laterality: Bilateral;    History reviewed. No pertinent family history.  Social History Social History   Substance Use Topics  . Smoking status: Former Smoker -- 0.50 packs/day for 60 years    Types: Cigarettes    Quit date: 03/10/2014  . Smokeless tobacco: Current User    Types: Chew  . Alcohol Use: No    Current Outpatient Prescriptions  Medication Sig Dispense Refill  . amLODipine (NORVASC) 10 MG tablet Take 1 tablet by mouth daily.    Marland Kitchen aspirin 81 MG tablet Take 81 mg by mouth daily.    Marland Kitchen losartan (COZAAR) 100 MG tablet Take 1 tablet by mouth daily.    Marland Kitchen MEGARED OMEGA-3 KRILL OIL PO Take 1 tablet by mouth daily.    . metoprolol (TOPROL-XL) 200 MG 24 hr tablet Take 1 tablet by mouth daily.    . Multiple Vitamin (MULTIVITAMIN) tablet Take 1 tablet by mouth daily.    . simvastatin (ZOCOR) 40 MG tablet Take 1 tablet by mouth daily.     No current facility-administered medications for this visit.    No Known Allergies  Review of Systems         Review of Systems :  [ y ] = yes, [  ] = no        General :  Weight gain [ yes-mild  ]    Weight loss  [   ]  Fatigue [  ]  Fever [  ]  Chills  [  ]                                Weakness  [  ]  Cardiac :  Chest pain/ pressure [  ]  Resting SOB [  ] exertional SOB [yes-mild  ]positive history of hypertension                        Orthopnea [  ]  Pedal edema  [place]  ]  Palpitations [  ] Syncope/presyncope '[ ]'$                         Paroxysmal nocturnal dyspnea [  ]        Pulmonary : cough [place]  ]  wheezing [  ]  Hemoptysis [  ] Sputum [ yes-cleared ] Snoring [  ]                              Pneumothorax [  ]  Sleep apnea [  ]       GI : Vomiting [  ]  Dysphagia [  ]  Melena  [  ]  Abdominal pain [  ] BRBPR [  ]              Heart burn [  ]  Constipation [  ] Diarrhea  [  ] Colonoscopy [  ]       GU : Hematuria [  ]  Dysuria [  ]  Nocturia [  ] UTI's [  ]       Vascular : Claudication [  ]  Rest pain [  ]  DVT [  ] Vein stripping [  ] leg ulcers [  ]                          TIA [  ] Stroke [  ]  Varicose veins [  ]        NEURO :  Headaches  [  ] Seizures [  ] Vision changes [  ] Paresthesias [  ]       Musculoskeletal :  Arthritis [  ] Gout  [  ]  Back pain [  ]  Joint pain [  ]       Skin :  Rash [  ]  Melanoma [  ]        Heme : Bleeding problems [  ]Clotting Disorders [  ] Anemia [  ]Blood Transfusion '[ ]'$        Endocrine : Diabetes [no  ] Thyroid Disorder  [  ]       Psych : Depression [  ]  Anxiety [  ]  Psych hospitalizations [  ]                                               BP 131/83 mmHg  Pulse 50  Resp 18  Ht '5\' 6"'$  (1.676 m)  Wt 212 lb (96.163 kg)  BMI 34.23 kg/m2  SpO2 96% Physical Exam      Physical Exam  General: obese Caucasian male no acute distress HEENT: Normocephalic pupils equal , dentition adequate Neck: Supple without JVD, adenopathy, or bruit Chest: Clear to auscultation, symmetrical breath sounds, no rhonchi, no tenderness             or deformity Cardiovascular:  Regular rate and rhythm, no murmur, no gallop, peripheral pulses             palpable in all extremities Abdomen:  Soft, nontender, no palpable mass or organomegaly Extremities: Warm, well-perfused, no clubbing cyanosis edema or tenderness,              no venous stasis changes of the legs Rectal/GU: Deferred Neuro: Grossly non--focal and symmetrical throughout Skin: Clean and dry without rash or ulceration   Diagnostics  Scans, PFTs, pathology report and bronchoscopy report personally reviewed  Impression: Biopsy-proven squamous carcinoma right upper lobe without evidence of mediastinal or distant metastatic disease. It appears resectable with lobectomy. Prior to schedule surgery he will need cardiac clearance because of his history of hypertension, hyperlipidemia, smoking, and dyspnea on exertion.  Plan:cardiology consultation and clearance has been submitted The patient to eturn for further discussion and counseling after the cardiology evaluation.   Len Childs, MD [facility] [phone]

## 2015-11-01 ENCOUNTER — Ambulatory Visit (INDEPENDENT_AMBULATORY_CARE_PROVIDER_SITE_OTHER): Payer: PPO | Admitting: Adult Health

## 2015-11-01 ENCOUNTER — Encounter: Payer: Self-pay | Admitting: Adult Health

## 2015-11-01 VITALS — BP 130/70 | HR 55 | Temp 97.8°F | Ht 66.0 in | Wt 217.0 lb

## 2015-11-01 DIAGNOSIS — J449 Chronic obstructive pulmonary disease, unspecified: Secondary | ICD-10-CM

## 2015-11-01 DIAGNOSIS — C3411 Malignant neoplasm of upper lobe, right bronchus or lung: Secondary | ICD-10-CM | POA: Diagnosis not present

## 2015-11-01 NOTE — Assessment & Plan Note (Signed)
Mild COPD , essentially asymptomatic  Hold on controller rx at this time.  Cont to follow  follow up 3-4 months and As needed

## 2015-11-01 NOTE — Assessment & Plan Note (Signed)
Recent evaluation showed RUL mass is positive for squamous cell carcinoma.  PET and MRI neg for Mets.  Undergoing surgical clearance for RUL lobectomy  PFT shows mild COPD w/ minimal airflow obstruction   Plan  Follow up with Cardiology as planned .  Follow up with Dr. Nils Pyle as planned.  Follow up with Dr. Lamonte Sakai  In 3-4 months and As needed

## 2015-11-01 NOTE — Progress Notes (Signed)
Subjective:    Patient ID: Dustin Bradshaw, male    DOB: November 16, 1940, 75 y.o.   MRN: [id number]  HPI 75 yo former smoker seen for pulmonary consult for lung mass with Dr. Lamonte Sakai     11/01/2015 Follow up : COPD and Lung Cancer  Pt returns for 6 weeks follow up . He was recently seen for RUL lung mass.  FOB on 09/17/15 that was  positive squamous cell carcinoma.  He has seen oncology with Dr. Julien Nordmann. , notes revewed .  PET scan showed hypermetabolic RUL mass w/ no evidence of mets.  , no hypermetabolic adenopathy noted.   and MRI was neg for mets.  Seen by Dr. Nils Pyle last week with plans for RUL lobectomy .  Has cardiac evaluation for surgical clearance next week.   Says breathing is doing okay.  Can walk flat surfaces with no dyspnea.  Gets winded with steps.  Denies chest pain, orthopnea, edema or fever.   PFT on 10/12/15 with FEV1 77%, ratio 68, FVC 81%, 11 % change with BD , DLCO 58%.    Past Medical History  Diagnosis Date  . Hypertension   . High cholesterol   . Primary cancer of right upper lobe of lung ([facility]) 10/06/2015   Current Outpatient Prescriptions on File Prior to Visit  Medication Sig Dispense Refill  . amLODipine (NORVASC) 10 MG tablet Take 1 tablet by mouth daily.    Marland Kitchen losartan (COZAAR) 100 MG tablet Take 1 tablet by mouth daily.    . metoprolol (TOPROL-XL) 200 MG 24 hr tablet Take 1 tablet by mouth daily.    . Multiple Vitamin (MULTIVITAMIN) tablet Take 1 tablet by mouth daily.    . simvastatin (ZOCOR) 40 MG tablet Take 1 tablet by mouth daily.    Marland Kitchen aspirin 81 MG tablet Take 81 mg by mouth daily. Reported on 11/01/2015    . MEGARED OMEGA-3 KRILL OIL PO Take 1 tablet by mouth daily. Reported on 11/01/2015     No current facility-administered medications on file prior to visit.     Review of Systems    Constitutional:   No  weight loss, night sweats,  Fevers, chills, fatigue, or  lassitude.  HEENT:   No headaches,  Difficulty swallowing,  Tooth/dental  problems, or  Sore throat,                No sneezing, itching, ear ache, nasal congestion, post nasal drip,   CV:  No chest pain,  Orthopnea, PND, swelling in lower extremities, anasarca, dizziness, palpitations, syncope.   GI  No heartburn, indigestion, abdominal pain, nausea, vomiting, diarrhea, change in bowel habits, loss of appetite, bloody stools.   Resp: No shortness of breath with exertion or at rest.  No excess mucus, no productive cough,  No non-productive cough,  No coughing up of blood.  No change in color of mucus.  No wheezing.  No chest wall deformity  Skin: no rash or lesions.  GU: no dysuria, change in color of urine, no urgency or frequency.  No flank pain, no hematuria   MS:  No joint pain or swelling.  No decreased range of motion.  No back pain.  Psych:  No change in mood or affect. No depression or anxiety.  No memory loss.      Objective:   Physical Exam  Filed Vitals:   11/01/15 1407  BP: 130/70  Pulse: 55  Temp: 97.8 F (36.6 C)  TempSrc: Oral  Height:  $'5\' 6"'S$  (1.676 m)  Weight: 217 lb (98.431 kg)  SpO2: 98%   GEN: A/Ox3; pleasant , NAD , obsee  HEENT:  [place]/AT,  EACs-clear, TMs-wnl, NOSE-clear, THROAT-clear, no lesions, no postnasal drip or exudate noted.   NECK:  Supple w/ fair ROM; no JVD; normal carotid impulses w/o bruits; no thyromegaly or nodules palpated; no lymphadenopathy.  RESP  Clear  P & A; w/o, wheezes/ rales/ or rhonchi.no accessory muscle use, no dullness to percussion  CARD:  RRR, no m/r/g  , no peripheral edema, pulses intact, no cyanosis or clubbing.  GI:   Soft & nt; nml bowel sounds; no organomegaly or masses detected.  Musco: Warm bil, no deformities or joint swelling noted.   Neuro: alert, no focal deficits noted.    Skin: Warm, no lesions or rashes          Assessment & Plan:

## 2015-11-01 NOTE — Patient Instructions (Addendum)
Follow up with Cardiology as planned .  Follow up with Dr. Nils Pyle as planned.  Follow up with Dr. Lamonte Sakai  In 3-4 months and As needed

## 2015-11-10 ENCOUNTER — Ambulatory Visit (HOSPITAL_COMMUNITY): Payer: PPO | Attending: Cardiovascular Disease

## 2015-11-10 ENCOUNTER — Other Ambulatory Visit: Payer: Self-pay

## 2015-11-10 DIAGNOSIS — K769 Liver disease, unspecified: Secondary | ICD-10-CM | POA: Insufficient documentation

## 2015-11-10 DIAGNOSIS — Z01810 Encounter for preprocedural cardiovascular examination: Secondary | ICD-10-CM | POA: Diagnosis not present

## 2015-11-10 DIAGNOSIS — G459 Transient cerebral ischemic attack, unspecified: Secondary | ICD-10-CM | POA: Insufficient documentation

## 2015-11-10 DIAGNOSIS — I517 Cardiomegaly: Secondary | ICD-10-CM | POA: Diagnosis not present

## 2015-11-11 ENCOUNTER — Ambulatory Visit (INDEPENDENT_AMBULATORY_CARE_PROVIDER_SITE_OTHER): Payer: PPO | Admitting: Cardiology

## 2015-11-11 ENCOUNTER — Encounter: Payer: Self-pay | Admitting: Cardiology

## 2015-11-11 VITALS — BP 136/90 | HR 55 | Ht 66.0 in

## 2015-11-11 DIAGNOSIS — J438 Other emphysema: Secondary | ICD-10-CM

## 2015-11-11 DIAGNOSIS — C3411 Malignant neoplasm of upper lobe, right bronchus or lung: Secondary | ICD-10-CM

## 2015-11-11 DIAGNOSIS — E669 Obesity, unspecified: Secondary | ICD-10-CM | POA: Diagnosis not present

## 2015-11-11 DIAGNOSIS — Z01810 Encounter for preprocedural cardiovascular examination: Secondary | ICD-10-CM | POA: Diagnosis not present

## 2015-11-11 NOTE — Progress Notes (Addendum)
Cardiology Office Note    Date:  11/11/2015   ID:  Dustin Bradshaw, DOB August 25, 1941, MRN [id number]  PCP:  [name], DO  Cardiologist:   Candee Furbish, MD     History of Present Illness:  Dustin Bradshaw is a 75 y.o. male here for preoperative risk evaluation at the request of Dr. 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  He also has large cystic lesion in his liver as well as kidneys.  Has a history of hypertension, hyperlipidemia, smoking, dyspnea on exertion, long-standing smoker. 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  He denies any specific chest pain. Short of breath with exertion. Denies any previous cardiac history. Both his parents died in their 52s without heart disease.    Past Medical History  Diagnosis Date  . Hypertension   . High cholesterol   . Primary cancer of right upper lobe of lung ([place]) 10/06/2015    Past Surgical History  Procedure Laterality Date  . Eye surgery    . Yag laser application Left 50/53/9767    Procedure: YAG LASER APPLICATION;  Surgeon: Dustin Guys, MD;  Location: [facility];  Service: Ophthalmology;  Laterality: Left;  . Video bronchoscopy Bilateral 09/17/2015    Procedure: VIDEO BRONCHOSCOPY WITH FLUORO;  Surgeon: Dustin Gobble, MD;  Location: [facility];  Service: Cardiopulmonary;  Laterality: Bilateral;    Outpatient Prescriptions Prior to Visit  Medication Sig Dispense Refill  . amLODipine (NORVASC) 10 MG tablet Take 1 tablet by mouth daily.    Marland Kitchen losartan (COZAAR) 100 MG tablet Take 1 tablet by mouth daily.    . metoprolol (TOPROL-XL) 200 MG 24 hr tablet Take 1 tablet by mouth daily.    . Multiple Vitamin (MULTIVITAMIN) tablet Take 1 tablet by mouth daily.    . simvastatin (ZOCOR) 40 MG tablet Take 1 tablet by mouth daily.    Marland Kitchen aspirin 81 MG tablet Take 81  mg by mouth daily. Reported on 11/01/2015    . MEGARED OMEGA-3 KRILL OIL PO Take 1 tablet by mouth daily. Reported on 11/01/2015     No facility-administered medications prior to visit.     Allergies:   Review of patient's allergies indicates no known allergies.   Social History   Social History  . Marital Status: Married    Spouse Name: N/A  . Number of Children: N/A  . Years of Education: N/A   Social History Main Topics  . Smoking status: Former Smoker -- 0.50 packs/day for 60 years    Types: Cigarettes    Quit date: 03/10/2014  . Smokeless tobacco: Current User    Types: Chew  . Alcohol Use: No  . Drug Use: No  . Sexual Activity: Not Currently   Other Topics Concern  . None   Social History Narrative     Family History:  The patient's has no early family history of coronary artery disease   ROS:   Please see the history of present illness.    ROS shortness of breath, no significant chest pain. Positive obesity. All other systems reviewed and are negative.   PHYSICAL EXAM:   VS:  BP 136/90 mmHg  Pulse 55  Ht '5\' 6"'$  (1.676 m)   GEN: Well nourished, well developed, in no acute distress HEENT: normal Neck: no JVD, carotid bruits, or masses Cardiac: RRR;  no murmurs, rubs, or gallops,no edema  Respiratory:  clear to auscultation bilaterally, normal work of breathing GI: soft, nontender, nondistended, + BS, obesity MS: no deformity or atrophy Skin: warm and dry, no rash Neuro:  Alert and Oriented x 3, Strength and sensation are intact Psych: euthymic mood, full affect  Wt Readings from Last 3 Encounters:  11/01/15 217 lb (98.431 kg)  10/29/15 212 lb (96.163 kg)  10/25/15 214 lb 11.2 oz (97.387 kg)      Studies/Labs Reviewed:   EKG:  EKG is ordered today.  The ekg ordered today demonstrates 11/11/15-sinus bradycardia rate 55 with no other abnormalities. Personally viewed.  Echocardiogram: 11/10/15-normal ejection fraction, liver cyst noted  Recent  Labs: 10/06/2015: ALT 58*; BUN 21.4; Creatinine 1.1; HGB 14.2; Platelets 237; Potassium 3.8; Sodium 142   Lipid Panel No results found for: CHOL, TRIG, HDL, CHOLHDL, VLDL, LDLCALC, LDLDIRECT  Additional studies/ records that were reviewed today include:  Prior office records, blood work, CT scans reviewed    ASSESSMENT:    1. Preop cardiovascular exam   2. Primary cancer of right upper lobe of lung ([facility])   3. Other emphysema ([place])   4. Obesity      PLAN:  In order of problems listed above:  1. Preop exam-given his history of long-standing smoking, obesity, hypertension, age, dyspnea, we will proceed with nuclear stress test, pharmacologic prior to surgery for further risk stratification. 2. He has seen Dr. Darcey Bradshaw, lobectomy 3. Long-standing history of smoking. He also did Financial planner work, Financial planner for several years. He inhaled quite a bit of dust. 4. Encourage weight loss. He states that he loves bread. He also is a stress eater. He and his wife have been married for 33 years.    Medication Adjustments/Labs and Tests Ordered: Current medicines are reviewed at length with the patient today.  Concerns regarding medicines are outlined above.  Medication changes, Labs and Tests ordered today are listed in the Patient Instructions below. There are no Patient Instructions on file for this visit.     Dustin Rumpf, MD  11/11/2015 2:47 PM    [facility] [place], [place], [place]  [address] Phone: [phone]; Fax: [phone]

## 2015-11-11 NOTE — Patient Instructions (Signed)
Medication Instructions:  The current medical regimen is effective;  continue present plan and medications.  Testing/Procedures: Your physician has requested that you have a lexiscan myoview. For further information please visit [email or web address]. Please follow instruction sheet, as given.  Follow-Up: Follow up as needed with Dr Marlou Porch after testing.  If you need a refill on your cardiac medications before your next appointment, please call your pharmacy.  Thank you for choosing [facility]!!

## 2015-11-16 ENCOUNTER — Encounter (HOSPITAL_COMMUNITY): Payer: PPO

## 2015-11-16 ENCOUNTER — Encounter (HOSPITAL_COMMUNITY)
Admission: RE | Admit: 2015-11-16 | Discharge: 2015-11-16 | Disposition: A | Discharge status: Home / Self Care | Payer: PPO | Source: Ambulatory Visit | Attending: Cardiology | Admitting: Cardiology

## 2015-11-16 DIAGNOSIS — I10 Essential (primary) hypertension: Secondary | ICD-10-CM | POA: Diagnosis not present

## 2015-11-16 DIAGNOSIS — Z01818 Encounter for other preprocedural examination: Secondary | ICD-10-CM | POA: Insufficient documentation

## 2015-11-16 DIAGNOSIS — R0602 Shortness of breath: Secondary | ICD-10-CM | POA: Diagnosis not present

## 2015-11-16 DIAGNOSIS — C3491 Malignant neoplasm of unspecified part of right bronchus or lung: Secondary | ICD-10-CM | POA: Diagnosis not present

## 2015-11-16 DIAGNOSIS — Z01810 Encounter for preprocedural cardiovascular examination: Secondary | ICD-10-CM

## 2015-11-16 DIAGNOSIS — C3411 Malignant neoplasm of upper lobe, right bronchus or lung: Secondary | ICD-10-CM

## 2015-11-16 LAB — NM MYOCAR MULTI W/SPECT W/WALL MOTION / EF
CSEPPHR: 106 {beats}/min
Rest HR: 53 {beats}/min

## 2015-11-16 MED ORDER — REGADENOSON 0.4 MG/5ML IV SOLN
0.4000 mg | Freq: Once | INTRAVENOUS | Status: AC
  Administered 2015-11-16: 0.4 mg via INTRAVENOUS

## 2015-11-16 MED ORDER — TECHNETIUM TC 99M SESTAMIBI GENERIC - CARDIOLITE
30.0000 | Freq: Once | INTRAVENOUS | Status: AC | PRN
  Administered 2015-11-16: 30 via INTRAVENOUS

## 2015-11-16 MED ORDER — TECHNETIUM TC 99M SESTAMIBI GENERIC - CARDIOLITE
10.0000 | Freq: Once | INTRAVENOUS | Status: AC | PRN
  Administered 2015-11-16: 10 via INTRAVENOUS

## 2015-11-16 MED ORDER — REGADENOSON 0.4 MG/5ML IV SOLN
INTRAVENOUS | Status: AC
Start: 2015-11-16 — End: 2015-11-16
  Administered 2015-11-16: 0.4 mg via INTRAVENOUS
  Filled 2015-11-16: qty 5

## 2015-11-17 ENCOUNTER — Ambulatory Visit (INDEPENDENT_AMBULATORY_CARE_PROVIDER_SITE_OTHER): Payer: PPO | Admitting: Cardiothoracic Surgery

## 2015-11-17 ENCOUNTER — Encounter: Payer: Self-pay | Admitting: Cardiothoracic Surgery

## 2015-11-17 ENCOUNTER — Other Ambulatory Visit: Payer: Self-pay | Admitting: *Deleted

## 2015-11-17 VITALS — BP 141/91 | HR 55 | Resp 16 | Ht 66.0 in | Wt 212.0 lb

## 2015-11-17 DIAGNOSIS — R918 Other nonspecific abnormal finding of lung field: Secondary | ICD-10-CM

## 2015-11-17 DIAGNOSIS — C3491 Malignant neoplasm of unspecified part of right bronchus or lung: Secondary | ICD-10-CM

## 2015-11-17 NOTE — Progress Notes (Signed)
PCP is [name], DO Referring Provider is Curt Bears, MD  Chief Complaint  Patient presents with  . Follow-up    with ECHO 11/10/15 and CARDIAC CLEARANCE...STRESS TEST 11/16/15... prior to lung surgery    HPI:the patient returns for further followup of a 4 cm central right upper lobe squamous cell cancer. The patient was seen by Dr. Julien Nordmann who completed clinical staging with PET scan and brain MRI and PFTs. The scan showed the tumor to be extremely hypermetabolic with a close margin to the pulmonary artery but probably resectable with a lobectomy. No evidence of mediastinal nodal involvement or distant metastatic disease. PFTs are consistent with right upper lobectomy but not more. Because the patient had coronary calcifications and risk factors for CAD including smoking hyperlipidemia and hypertension a cardiology clearance was performed. Echocardiogram showed normal LV EF without significant valvular disease. No evidence of pulmonary hypertension.estimated CVP. Myocardial perfusion stress test was negative for ischemia with ejection fraction of 54%.  The patient now presents for discussion and scheduling date of surgery to include video bronchoscopy and right upper lobectomy. He understands at age 75 he would be at risk for significant morbidity-mortality. He understands that if the findings at surgery indicated that a right upper lobectomy would not be potentially curative that he would not be candidate for bilobectomy or pneumonectomy. He understands there is a 5-10% risk of death or major morbidity. Because he has biopsy-proven lung cancer which has a poor prognosis for cure without surgery he wishes to proceed with operation.  Past Medical History  Diagnosis Date  . Hypertension   . High cholesterol   . Primary cancer of right upper lobe of lung ([place]) 10/06/2015    Past Surgical History  Procedure Laterality Date  . Eye surgery    . Yag laser application Left 12/45/8099   Procedure: YAG LASER APPLICATION;  Surgeon: Rutherford Guys, MD;  Location: [facility];  Service: Ophthalmology;  Laterality: Left;  . Video bronchoscopy Bilateral 09/17/2015    Procedure: VIDEO BRONCHOSCOPY WITH FLUORO;  Surgeon: Collene Gobble, MD;  Location: [facility];  Service: Cardiopulmonary;  Laterality: Bilateral;    No family history on file.  Social History Social History  Substance Use Topics  . Smoking status: Former Smoker -- 0.50 packs/day for 60 years    Types: Cigarettes    Quit date: 03/10/2014  . Smokeless tobacco: Current User    Types: Chew  . Alcohol Use: No    Current Outpatient Prescriptions  Medication Sig Dispense Refill  . amLODipine (NORVASC) 10 MG tablet Take 1 tablet by mouth daily.    Marland Kitchen losartan (COZAAR) 100 MG tablet Take 1 tablet by mouth daily.    . metoprolol (TOPROL-XL) 200 MG 24 hr tablet Take 1 tablet by mouth daily.    . Multiple Vitamin (MULTIVITAMIN) tablet Take 1 tablet by mouth daily.    . simvastatin (ZOCOR) 40 MG tablet Take 1 tablet by mouth daily.     No current facility-administered medications for this visit.    No Known Allergies  Review of Systems          Review of Systems :  [ y ] = yes, [  ] = no Patient really has no significant pulmonary symptoms He is been gaining weight, probably related to the holidays and anxiety over possible surgery He's had no recent symptoms of head cold or bronchitis         General :  Weight gain [ Mild  ]  Weight loss  [   ]  Fatigue [  ]  Fever [  ]  Chills  [  ]                                Weakness  [  ]           Cardiac :  Chest pain/ pressure [  ]  Resting SOB [  ] exertional SOB [  ]                        Orthopnea [  ]  Pedal edema  [  ]  Palpitations [  ] Syncope/presyncope '[ ]'$                         Paroxysmal nocturnal dyspnea [  ]        Pulmonary : cough [  ]  wheezing [  ]  Hemoptysis [  ] Sputum [  ] Snoring [  ]                              Pneumothorax [  ]  Sleep apnea [   ]       GI : Vomiting [  ]  Dysphagia [  ]  Melena  [  ]  Abdominal pain [  ] BRBPR [  ]              Heart burn [  ]  Constipation [  ] Diarrhea  [  ] Colonoscopy [  ]       GU : Hematuria [  ]  Dysuria [  ]  Nocturia [  ] UTI's [  ]       Vascular : Claudication [  ]  Rest pain [  ]  DVT [  ] Vein stripping [  ] leg ulcers [  ]                          TIA [  ] Stroke [  ]  Varicose veins [  ]       NEURO :  Headaches  [  ] Seizures [  ] Vision changes [  ] Paresthesias [  ]       Musculoskeletal :  Arthritis [  ] Gout  [  ]  Back pain [  ]  Joint pain [  ]       Skin :  Rash [  ]  Melanoma [  ]        Heme : Bleeding problems [  ]Clotting Disorders [  ] Anemia [  ]Blood Transfusion '[ ]'$        Endocrine : Diabetes [  ] Thyroid Disorder  [  ]       Psych : Depression [  ]  Anxiety [  ]  Psych hospitalizations [  ]                                               BP 141/91 mmHg  Pulse 55  Resp 16  Ht '5\' 6"'$  (1.676 m)  Wt 212 lb (  96.163 kg)  BMI 34.23 kg/m2  SpO2 96% Physical Exam      Physical Exam  General: obese Caucasian male appears younger than his stated age of 5 years. He is in no distress accompanied by his wife. HEENT: Normocephalic pupils equal , dentition adequate Neck: Supple without JVD, adenopathy, or bruit Chest: Clear to auscultation, symmetrical breath sounds, no rhonchi, no tenderness             or deformity Cardiovascular: Regular rate and rhythm, no murmur, no gallop, peripheral pulses             palpable in all extremities Abdomen:  Soft, nontender, no palpable mass or organomegaly Extremities: Warm, well-perfused, no clubbing cyanosis edema or tenderness,              no venous stasis changes of the legs Rectal/GU: Deferred Neuro: Grossly non--focal and symmetrical throughout Skin: Clean and dry without rash or ulceration   Diagnostic Tests: PET scan, CT scan, PFTs, myocardial perfusion scan and echocardiogram all purse are reviewed and counseled  with patient and wife  Impression: Potentially resectable 4 cm up lobe biopsy proven squamous cell cancer  Plan:we'll schedule video bronchoscopy and possible right VATS, right upper lobectomy for the patient on February 14. Details of procedure, preparation for surgery, expected recovery, and potential risks all discussed in detail the patient and wife.   Len Childs, MD [facility] [phone]

## 2015-11-22 ENCOUNTER — Encounter (HOSPITAL_COMMUNITY)
Admission: RE | Admit: 2015-11-22 | Discharge: 2015-11-22 | Disposition: A | Discharge status: Home / Self Care | Payer: PPO | Source: Ambulatory Visit | Attending: Cardiothoracic Surgery | Admitting: Cardiothoracic Surgery

## 2015-11-22 ENCOUNTER — Encounter (HOSPITAL_COMMUNITY): Payer: Self-pay

## 2015-11-22 ENCOUNTER — Ambulatory Visit (HOSPITAL_COMMUNITY)
Admission: RE | Admit: 2015-11-22 | Discharge: 2015-11-22 | Disposition: A | Discharge status: Home / Self Care | Payer: PPO | Source: Ambulatory Visit | Attending: Cardiothoracic Surgery | Admitting: Cardiothoracic Surgery

## 2015-11-22 VITALS — BP 140/76 | HR 58 | Temp 98.2°F | Resp 18 | Ht 66.0 in | Wt 220.5 lb

## 2015-11-22 DIAGNOSIS — Z87891 Personal history of nicotine dependence: Secondary | ICD-10-CM

## 2015-11-22 DIAGNOSIS — Z01818 Encounter for other preprocedural examination: Secondary | ICD-10-CM | POA: Diagnosis not present

## 2015-11-22 DIAGNOSIS — C3491 Malignant neoplasm of unspecified part of right bronchus or lung: Secondary | ICD-10-CM

## 2015-11-22 DIAGNOSIS — R918 Other nonspecific abnormal finding of lung field: Secondary | ICD-10-CM | POA: Diagnosis not present

## 2015-11-22 DIAGNOSIS — J9811 Atelectasis: Secondary | ICD-10-CM | POA: Insufficient documentation

## 2015-11-22 DIAGNOSIS — C3412 Malignant neoplasm of upper lobe, left bronchus or lung: Secondary | ICD-10-CM | POA: Diagnosis not present

## 2015-11-22 DIAGNOSIS — Z01812 Encounter for preprocedural laboratory examination: Secondary | ICD-10-CM | POA: Insufficient documentation

## 2015-11-22 HISTORY — DX: Reserved for inherently not codable concepts without codable children: IMO0001

## 2015-11-22 HISTORY — DX: Pneumonia, unspecified organism: J18.9

## 2015-11-22 LAB — TYPE AND SCREEN
ABO/RH(D): A POS
Antibody Screen: NEGATIVE

## 2015-11-22 LAB — CBC
HCT: 45.4 % (ref 39.0–52.0)
Hemoglobin: 15.3 g/dL (ref 13.0–17.0)
MCH: 31.4 pg (ref 26.0–34.0)
MCHC: 33.7 g/dL (ref 30.0–36.0)
MCV: 93.2 fL (ref 78.0–100.0)
Platelets: 219 10*3/uL (ref 150–400)
RBC: 4.87 MIL/uL (ref 4.22–5.81)
RDW: 14 % (ref 11.5–15.5)
WBC: 8.7 10*3/uL (ref 4.0–10.5)

## 2015-11-22 LAB — PROTIME-INR
INR: 0.98 (ref 0.00–1.49)
Prothrombin Time: 13.2 seconds (ref 11.6–15.2)

## 2015-11-22 LAB — BLOOD GAS, ARTERIAL
Acid-base deficit: 0.5 mmol/L (ref 0.0–2.0)
Bicarbonate: 23.8 mEq/L (ref 20.0–24.0)
Drawn by: 449841
FIO2: 0.21
O2 Saturation: 95.8 %
Patient temperature: 98.6
TCO2: 25.1 mmol/L (ref 0–100)
pCO2 arterial: 40.3 mmHg (ref 35.0–45.0)
pH, Arterial: 7.39 (ref 7.350–7.450)
pO2, Arterial: 82.6 mmHg (ref 80.0–100.0)

## 2015-11-22 LAB — COMPREHENSIVE METABOLIC PANEL
ALT: 43 U/L (ref 17–63)
AST: 32 U/L (ref 15–41)
Albumin: 3.5 g/dL (ref 3.5–5.0)
Alkaline Phosphatase: 82 U/L (ref 38–126)
Anion gap: 13 (ref 5–15)
BUN: 18 mg/dL (ref 6–20)
CO2: 21 mmol/L — ABNORMAL LOW (ref 22–32)
Calcium: 9.3 mg/dL (ref 8.9–10.3)
Chloride: 106 mmol/L (ref 101–111)
Creatinine, Ser: 1.14 mg/dL (ref 0.61–1.24)
GFR calc Af Amer: >60 mL/min (ref >60)
GFR calc non Af Amer: >60 mL/min (ref >60)
Glucose, Bld: 110 mg/dL — ABNORMAL HIGH (ref 65–99)
Potassium: 3.8 mmol/L (ref 3.5–5.1)
Sodium: 140 mmol/L (ref 135–145)
Total Bilirubin: 1.2 mg/dL (ref 0.3–1.2)
Total Protein: 6.6 g/dL (ref 6.5–8.1)

## 2015-11-22 LAB — URINALYSIS, ROUTINE W REFLEX MICROSCOPIC
Bilirubin Urine: NEGATIVE
Glucose, UA: NEGATIVE mg/dL
Hgb urine dipstick: NEGATIVE
Ketones, ur: NEGATIVE mg/dL
Leukocytes, UA: NEGATIVE
Nitrite: NEGATIVE
Protein, ur: NEGATIVE mg/dL
Specific Gravity, Urine: 1.016 (ref 1.005–1.030)
pH: 6 (ref 5.0–8.0)

## 2015-11-22 LAB — ABO/RH: ABO/RH(D): A POS

## 2015-11-22 LAB — SURGICAL PCR SCREEN
MRSA, PCR: NEGATIVE
Staphylococcus aureus: NEGATIVE

## 2015-11-22 LAB — APTT: aPTT: 29 seconds (ref 24–37)

## 2015-11-22 MED ORDER — CEFUROXIME SODIUM 1.5 G IJ SOLR
1.5000 g | INTRAMUSCULAR | Status: AC
  Administered 2015-11-23: 1.5 g via INTRAVENOUS

## 2015-11-23 ENCOUNTER — Inpatient Hospital Stay (HOSPITAL_COMMUNITY): Payer: PPO | Admitting: Critical Care Medicine

## 2015-11-23 ENCOUNTER — Encounter: Payer: Self-pay | Admitting: *Deleted

## 2015-11-23 ENCOUNTER — Encounter (HOSPITAL_COMMUNITY): Payer: Self-pay | Admitting: *Deleted

## 2015-11-23 ENCOUNTER — Telehealth: Payer: Self-pay | Admitting: *Deleted

## 2015-11-23 ENCOUNTER — Inpatient Hospital Stay (HOSPITAL_COMMUNITY)
Admission: RE | Admit: 2015-11-23 | Discharge: 2015-11-23 | DRG: 182 | Disposition: A | Discharge status: Home / Self Care | Payer: PPO | Source: Ambulatory Visit | Attending: Cardiothoracic Surgery | Admitting: Cardiothoracic Surgery

## 2015-11-23 ENCOUNTER — Encounter (HOSPITAL_COMMUNITY)
Admission: RE | Disposition: A | Discharge status: Home / Self Care | Payer: Self-pay | Source: Ambulatory Visit | Attending: Cardiothoracic Surgery

## 2015-11-23 DIAGNOSIS — R918 Other nonspecific abnormal finding of lung field: Secondary | ICD-10-CM

## 2015-11-23 DIAGNOSIS — Z87891 Personal history of nicotine dependence: Secondary | ICD-10-CM | POA: Diagnosis not present

## 2015-11-23 DIAGNOSIS — C3491 Malignant neoplasm of unspecified part of right bronchus or lung: Secondary | ICD-10-CM

## 2015-11-23 DIAGNOSIS — D0221 Carcinoma in situ of right bronchus and lung: Secondary | ICD-10-CM | POA: Diagnosis not present

## 2015-11-23 DIAGNOSIS — C3412 Malignant neoplasm of upper lobe, left bronchus or lung: Secondary | ICD-10-CM | POA: Diagnosis not present

## 2015-11-23 DIAGNOSIS — R0602 Shortness of breath: Secondary | ICD-10-CM | POA: Diagnosis not present

## 2015-11-23 HISTORY — PX: VIDEO BRONCHOSCOPY: SHX5072

## 2015-11-23 SURGERY — BRONCHOSCOPY, VIDEO-ASSISTED
Anesthesia: General | Site: Chest

## 2015-11-23 MED ORDER — HYDROMORPHONE HCL 1 MG/ML IJ SOLN: 0.2500 mg | INTRAMUSCULAR | Status: DC | PRN

## 2015-11-23 MED ORDER — PROPOFOL 10 MG/ML IV BOLUS
INTRAVENOUS | Status: AC
  Filled 2015-11-23: qty 20

## 2015-11-23 MED ORDER — PROPOFOL 10 MG/ML IV BOLUS
INTRAVENOUS | Status: DC | PRN
  Administered 2015-11-23: 100 mg via INTRAVENOUS

## 2015-11-23 MED ORDER — SUCCINYLCHOLINE CHLORIDE 20 MG/ML IJ SOLN
INTRAMUSCULAR | Status: AC
  Filled 2015-11-23: qty 1

## 2015-11-23 MED ORDER — 0.9 % SODIUM CHLORIDE (POUR BTL) OPTIME
TOPICAL | Status: DC | PRN
  Administered 2015-11-23: 2000 mL

## 2015-11-23 MED ORDER — LIDOCAINE HCL (CARDIAC) 20 MG/ML IV SOLN
INTRAVENOUS | Status: AC
  Filled 2015-11-23: qty 10

## 2015-11-23 MED ORDER — SODIUM CHLORIDE 0.9 % IJ SOLN
INTRAMUSCULAR | Status: AC
  Filled 2015-11-23: qty 10

## 2015-11-23 MED ORDER — ROCURONIUM BROMIDE 50 MG/5ML IV SOLN
INTRAVENOUS | Status: AC
  Filled 2015-11-23: qty 1

## 2015-11-23 MED ORDER — DEXTROSE 5 % IV SOLN
10.0000 mg | INTRAVENOUS | Status: DC | PRN
  Administered 2015-11-23: 20 ug/min via INTRAVENOUS

## 2015-11-23 MED ORDER — ROCURONIUM BROMIDE 100 MG/10ML IV SOLN
INTRAVENOUS | Status: DC | PRN
  Administered 2015-11-23: 50 mg via INTRAVENOUS

## 2015-11-23 MED ORDER — LACTATED RINGERS IV SOLN
INTRAVENOUS | Status: DC | PRN
  Administered 2015-11-23: 07:00:00 via INTRAVENOUS

## 2015-11-23 MED ORDER — SUGAMMADEX SODIUM 500 MG/5ML IV SOLN
INTRAVENOUS | Status: AC
  Filled 2015-11-23: qty 5

## 2015-11-23 MED ORDER — EPHEDRINE SULFATE 50 MG/ML IJ SOLN
INTRAMUSCULAR | Status: DC | PRN
  Administered 2015-11-23 (×2): 5 mg via INTRAVENOUS

## 2015-11-23 MED ORDER — BUPIVACAINE-EPINEPHRINE (PF) 0.5% -1:200000 IJ SOLN
INTRAMUSCULAR | Status: DC | PRN
  Administered 2015-11-23: 25 mL via PERINEURAL

## 2015-11-23 MED ORDER — CEFUROXIME SODIUM 1.5 G IJ SOLR
INTRAMUSCULAR | Status: AC
  Filled 2015-11-23: qty 1.5

## 2015-11-23 MED ORDER — MIDAZOLAM HCL 5 MG/5ML IJ SOLN
INTRAMUSCULAR | Status: DC | PRN
  Administered 2015-11-23 (×2): 1 mg via INTRAVENOUS

## 2015-11-23 MED ORDER — PROMETHAZINE HCL 25 MG/ML IJ SOLN: 6.2500 mg | INTRAMUSCULAR | Status: DC | PRN

## 2015-11-23 MED ORDER — ARTIFICIAL TEARS OP OINT
TOPICAL_OINTMENT | OPHTHALMIC | Status: AC
  Filled 2015-11-23: qty 3.5

## 2015-11-23 MED ORDER — FENTANYL CITRATE (PF) 250 MCG/5ML IJ SOLN
INTRAMUSCULAR | Status: AC
  Filled 2015-11-23: qty 5

## 2015-11-23 MED ORDER — LIDOCAINE HCL (CARDIAC) 20 MG/ML IV SOLN
INTRAVENOUS | Status: DC | PRN
  Administered 2015-11-23: 70 mg via INTRAVENOUS

## 2015-11-23 MED ORDER — FENTANYL CITRATE (PF) 100 MCG/2ML IJ SOLN
INTRAMUSCULAR | Status: DC | PRN
  Administered 2015-11-23: 50 ug via INTRAVENOUS
  Administered 2015-11-23: 75 ug via INTRAVENOUS

## 2015-11-23 MED ORDER — ONDANSETRON HCL 4 MG/2ML IJ SOLN
INTRAMUSCULAR | Status: DC | PRN
  Administered 2015-11-23: 4 mg via INTRAVENOUS

## 2015-11-23 MED ORDER — EPHEDRINE SULFATE 50 MG/ML IJ SOLN
INTRAMUSCULAR | Status: AC
  Filled 2015-11-23: qty 3

## 2015-11-23 MED ORDER — ONDANSETRON HCL 4 MG/2ML IJ SOLN
INTRAMUSCULAR | Status: AC
  Filled 2015-11-23: qty 2

## 2015-11-23 MED ORDER — SUGAMMADEX SODIUM 500 MG/5ML IV SOLN
INTRAVENOUS | Status: DC | PRN
  Administered 2015-11-23: 400 mg via INTRAVENOUS

## 2015-11-23 MED ORDER — MIDAZOLAM HCL 2 MG/2ML IJ SOLN
INTRAMUSCULAR | Status: AC
  Filled 2015-11-23: qty 2

## 2015-11-23 SURGICAL SUPPLY — 91 items
ADH SKN CLS APL DERMABOND .7 (GAUZE/BANDAGES/DRESSINGS)
APL SRG 22X2 LUM MLBL SLNT (VASCULAR PRODUCTS)
APPLICATOR TIP EXT COSEAL (VASCULAR PRODUCTS) IMPLANT
BAG DECANTER FOR FLEXI CONT (MISCELLANEOUS) IMPLANT
BALL CTTN LRG ABS STRL LF (GAUZE/BANDAGES/DRESSINGS)
BLADE SURG 11 STRL SS (BLADE) ×4 IMPLANT
BRUSH CYTOL CELLEBRITY 1.5X140 (MISCELLANEOUS) IMPLANT
CANISTER SUCTION 2500CC (MISCELLANEOUS) ×4 IMPLANT
CATH KIT ON Q 5IN SLV (PAIN MANAGEMENT) IMPLANT
CATH THORACIC 28FR (CATHETERS) IMPLANT
CATH THORACIC 36FR (CATHETERS) IMPLANT
CATH THORACIC 36FR RT ANG (CATHETERS) IMPLANT
CONN Y 3/8X3/8X3/8  BEN (MISCELLANEOUS)
CONN Y 3/8X3/8X3/8 BEN (MISCELLANEOUS) IMPLANT
CONT SPEC 4OZ CLIKSEAL STRL BL (MISCELLANEOUS) ×8 IMPLANT
COTTONBALL LRG STERILE PKG (GAUZE/BANDAGES/DRESSINGS) IMPLANT
COVER TABLE BACK 60X90 (DRAPES) ×4 IMPLANT
DERMABOND ADVANCED (GAUZE/BANDAGES/DRESSINGS)
DERMABOND ADVANCED .7 DNX12 (GAUZE/BANDAGES/DRESSINGS) IMPLANT
DRAPE LAPAROSCOPIC ABDOMINAL (DRAPES) ×4 IMPLANT
DRAPE WARM FLUID 44X44 (DRAPE) ×4 IMPLANT
ELECT BLADE 4.0 EZ CLEAN MEGAD (MISCELLANEOUS) ×4
ELECT REM PT RETURN 9FT ADLT (ELECTROSURGICAL) ×4
ELECTRODE BLDE 4.0 EZ CLN MEGD (MISCELLANEOUS) ×1 IMPLANT
ELECTRODE REM PT RTRN 9FT ADLT (ELECTROSURGICAL) ×2 IMPLANT
FORCEPS BIOP RJ4 1.8 (CUTTING FORCEPS) IMPLANT
GAUZE SPONGE 4X4 12PLY STRL (GAUZE/BANDAGES/DRESSINGS) ×4 IMPLANT
GLOVE BIO SURGEON STRL SZ7 (GLOVE) ×3 IMPLANT
GLOVE BIO SURGEON STRL SZ7.5 (GLOVE) ×8 IMPLANT
GLOVE BIOGEL PI IND STRL 7.0 (GLOVE) ×1 IMPLANT
GLOVE BIOGEL PI INDICATOR 7.0 (GLOVE) ×2
GOWN STRL REUS W/ TWL LRG LVL3 (GOWN DISPOSABLE) ×6 IMPLANT
GOWN STRL REUS W/TWL LRG LVL3 (GOWN DISPOSABLE) ×12
HANDLE STAPLE ENDO GIA SHORT (STAPLE) ×2
HEMOSTAT SURGICEL 2X14 (HEMOSTASIS) IMPLANT
KIT BASIN OR (CUSTOM PROCEDURE TRAY) ×4 IMPLANT
KIT CLEAN ENDO COMPLIANCE (KITS) ×4 IMPLANT
KIT ROOM TURNOVER OR (KITS) ×4 IMPLANT
KIT SUCTION CATH 14FR (SUCTIONS) ×4 IMPLANT
MARKER SKIN DUAL TIP RULER LAB (MISCELLANEOUS) ×4 IMPLANT
NDL BIOPSY TRANSBRONCH 21G (NEEDLE) IMPLANT
NDL BLUNT 18X1 FOR OR ONLY (NEEDLE) IMPLANT
NEEDLE BIOPSY TRANSBRONCH 21G (NEEDLE) IMPLANT
NEEDLE BLUNT 18X1 FOR OR ONLY (NEEDLE) IMPLANT
NEEDLE HYPO 22GX1.5 SAFETY (NEEDLE) IMPLANT
NS IRRIG 1000ML POUR BTL (IV SOLUTION) ×16 IMPLANT
OIL SILICONE PENTAX (PARTS (SERVICE/REPAIRS)) ×4 IMPLANT
PACK CHEST (CUSTOM PROCEDURE TRAY) ×4 IMPLANT
PAD ARMBOARD 7.5X6 YLW CONV (MISCELLANEOUS) ×12 IMPLANT
SEALANT PROGEL (MISCELLANEOUS) IMPLANT
SEALANT SURG COSEAL 4ML (VASCULAR PRODUCTS) IMPLANT
SEALANT SURG COSEAL 8ML (VASCULAR PRODUCTS) IMPLANT
SOLUTION ANTI FOG 6CC (MISCELLANEOUS) ×4 IMPLANT
SPONGE INTESTINAL PEANUT (DISPOSABLE) IMPLANT
SPONGE TONSIL 1.25 RF SGL STRG (GAUZE/BANDAGES/DRESSINGS) ×4 IMPLANT
STAPLER ENDO GIA 12 SHRT THIN (STAPLE) ×1 IMPLANT
STAPLER ENDO GIA 12MM SHORT (STAPLE) ×2 IMPLANT
STAPLER TA30 4.8 NON-ABS (STAPLE) IMPLANT
SUT CHROMIC 3 0 SH 27 (SUTURE) IMPLANT
SUT ETHILON 3 0 PS 1 (SUTURE) IMPLANT
SUT PROLENE 3 0 SH DA (SUTURE) IMPLANT
SUT PROLENE 4 0 RB 1 (SUTURE)
SUT PROLENE 4-0 RB1 .5 CRCL 36 (SUTURE) IMPLANT
SUT PROLENE 6 0 C 1 30 (SUTURE) IMPLANT
SUT SILK  1 MH (SUTURE) ×4
SUT SILK 1 MH (SUTURE) ×4 IMPLANT
SUT SILK 1 TIES 10X30 (SUTURE) IMPLANT
SUT SILK 2 0SH CR/8 30 (SUTURE) IMPLANT
SUT SILK 3 0SH CR/8 30 (SUTURE) IMPLANT
SUT VIC AB 1 CTX 18 (SUTURE) IMPLANT
SUT VIC AB 2 TP1 27 (SUTURE) IMPLANT
SUT VIC AB 2-0 CTX 36 (SUTURE) IMPLANT
SUT VIC AB 3-0 SH 18 (SUTURE) IMPLANT
SUT VIC AB 3-0 X1 27 (SUTURE) IMPLANT
SUT VICRYL 0 UR6 27IN ABS (SUTURE) IMPLANT
SUT VICRYL 2 TP 1 (SUTURE) IMPLANT
SWAB COLLECTION DEVICE MRSA (MISCELLANEOUS) IMPLANT
SYR 20ML ECCENTRIC (SYRINGE) ×4 IMPLANT
SYR 5ML LUER SLIP (SYRINGE) ×4 IMPLANT
SYR CONTROL 10ML LL (SYRINGE) IMPLANT
SYSTEM SAHARA CHEST DRAIN ATS (WOUND CARE) ×4 IMPLANT
TIP APPLICATOR SPRAY EXTEND 16 (VASCULAR PRODUCTS) IMPLANT
TOWEL OR 17X24 6PK STRL BLUE (TOWEL DISPOSABLE) ×8 IMPLANT
TOWEL OR 17X26 10 PK STRL BLUE (TOWEL DISPOSABLE) ×8 IMPLANT
TRAP SPECIMEN MUCOUS 40CC (MISCELLANEOUS) ×4 IMPLANT
TRAY FOLEY CATH 16FRSI W/METER (SET/KITS/TRAYS/PACK) ×4 IMPLANT
TUBE ANAEROBIC SPECIMEN COL (MISCELLANEOUS) IMPLANT
TUBE CONNECTING 20'X1/4 (TUBING) ×2
TUBE CONNECTING 20X1/4 (TUBING) ×6 IMPLANT
TUNNELER SHEATH ON-Q 11GX8 DSP (PAIN MANAGEMENT) IMPLANT
WATER STERILE IRR 1000ML POUR (IV SOLUTION) ×8 IMPLANT

## 2015-11-23 NOTE — Progress Notes (Signed)
The patient was examined and preop studies reviewed. There has been no change from the prior exam and the patient is ready for surgery.   Plan bronchoscopy and Right VATS, lobectomy on J Naeem

## 2015-11-23 NOTE — Telephone Encounter (Signed)
Oncology Nurse Navigator Documentation  Oncology Nurse Navigator Flowsheets 11/23/2015  Navigator Encounter Type Telephone/Per Dr. Darcey Nora I called patient to schedule with Med and Rad Onc.  I called and spoke with wife.  She states patient does not want tx.  I listened as she explained.  I updated her that this appt was a consultation.  I stated I wanted Mr. Bufford to be informed of dx and treatment options before making any decisions. I gave her the appt for 12/09/15 arrive at 1:30.  She verbalized understanding of appt time and place.   Telephone Outgoing Call  Treatment Phase Pre-Tx/Tx Discussion  Barriers/Navigation Needs Coordination of Care  Interventions Coordination of Care  Coordination of Care Appts  Acuity Level 1  Time Spent with Patient 15

## 2015-11-23 NOTE — Brief Op Note (Signed)
11/23/2015  8:28 AM  PATIENT:  Louann Liv  75 y.o. male  PRE-OPERATIVE DIAGNOSIS:  right upper lobe squamous cell cancer  POST-OPERATIVE DIAGNOSIS:  right upper lobe squamous cell cancer  PROCEDURE:  Procedure(s): VIDEO BRONCHOSCOPY (N/A)   Findings- extension of the known cancer into the R mainstem bronchus- would require R pneumonectomy which this 75 yo man would not survive based on PFTs , age  Plan- refer for chemoradiation, Dr Julien Nordmann  SURGEON:  Surgeon(s) and Role:    * Ivin Poot, MD - Primary  PHYSICIAN ASSISTANT:   ASSISTANTS: none   ANESTHESIA:   general  EBL:     BLOOD ADMINISTERED:none  DRAINS: none   LOCAL MEDICATIONS USED:  NONE  SPECIMEN:  No Specimen and Lumpectomy with ALND  DISPOSITION OF SPECIMEN:  N/A  COUNTS:  YES  TOURNIQUET:  * No tourniquets in log *  DICTATION: .Dragon Dictation  PLAN OF CARE: Discharge to home after PACU  PATIENT DISPOSITION:  PACU - hemodynamically stable.   Delay start of Pharmacological VTE agent (>24hrs) due to surgical blood loss or risk of bleeding: yes

## 2015-11-23 NOTE — Progress Notes (Signed)
Care of pt assumed by MA [facility] RN from J. Elnoria Howard RN

## 2015-11-23 NOTE — Op Note (Signed)
NAME:  MANAN, OLMO NO.:  [id number]  MEDICAL RECORD NO.:  [id number]  LOCATION:  [facility]                         FACILITY:  [facility]  PHYSICIAN:  Ivin Poot, M.D.  DATE OF BIRTH:  Jun 22, 1941  DATE OF PROCEDURE:  11/23/2015 DATE OF DISCHARGE:                              OPERATIVE REPORT   OPERATION:  Video bronchoscopy.  PREOPERATIVE DIAGNOSIS:  Left upper lobe biopsy proven squamous cell carcinoma of the lung.  POSTOPERATIVE DIAGNOSIS:  Left upper lobe biopsy proven squamous cell carcinoma of the lung.  SURGEON:  Ivin Poot, M.D.  ANESTHESIA:  General.  INDICATIONS:  The patient is a 75 year old reformed smoker who is referred for surgical resection of a biopsy-proven right upper lobe squamous cell carcinoma.  PET scan and CT scan and MRI scan showed no evidence of distant metastatic disease, although the tumor itself was approximately 4 cm and close to the main pulmonary artery.  His pulmonary function tests were compatible with possible lobectomy but not for pneumonectomy.  He underwent Cardiology evaluation because of coronary calcifications and his Myoview perfusion scan was negative for ischemia.  He was scheduled for video bronchoscopy and possible right VATS and lobectomy.  I discussed the procedure in detail with the patient and his wife.  I clearly expressed my concerns that this tumor was close to the mainstem bronchus on the CT scans and that potentially could be a resectable and would have to be treated with radiation and chemotherapy.  I discussed with the patient and his wife that we would proceed with video bronchoscopy first to assure that the tumor was resectable before doing the VATS incisions.  They understood and agreed to proceed.  OPERATIVE PROCEDURE:  The patient was brought to the operating room and placed supine on the operating table where general anesthesia was induced.  A proper time-out was performed.  Through the  endotracheal tube, the video bronchoscope was passed.  The distal trachea and carina were normal.  The left mainstem bronchus, the left upper lobe and left lower lobe endobronchial segments were visualized and had no endobronchial lesions.  The bronchoscope was passed on the right mainstem bronchus.  Extruding from the origin of the right upper lobe was a large tumor mass which completely occluded the right upper lobe and extended into the mainstem bronchus.  It also involved the right middle lobe bronchus.  This location was in compatible with lobectomy for curative resection and the patient required pneumonectomy.  Since the patient was not a candidate for pneumonectomy, the bronchoscope was withdrawn and the patient was reversed from anesthesia and sent back to recovery room.  He will be referred for radiation and chemotherapy.     Ivin Poot, M.D.     PV/MEDQ  D:  11/23/2015  T:  11/23/2015  Job:  [id number]  cc:   Eilleen Kempf, M.D.

## 2015-11-23 NOTE — Anesthesia Procedure Notes (Addendum)
Central Venous Catheter Insertion Performed by: anesthesiologist 11/23/2015 6:46 AM Patient location: Pre-op. Preanesthetic checklist: patient identified, risks and benefits discussed, surgical consent, monitors and equipment checked, pre-op evaluation and timeout performed Position: Trendelenburg Lidocaine 1% used for infiltration Landmarks identified and Seldinger technique used Catheter size: 8 Fr Central line was placed.Double lumen Procedure performed using ultrasound guided technique. Attempts: 1 Following insertion, line sutured, dressing applied and Biopatch. Post procedure assessment: blood return through all ports, free fluid flow and no air. Patient tolerated the procedure well with no immediate complications. Additional procedure comments: CVP: Timeout, sterile prep, drape, FBP R neck.  Trendelenburg position.  1% lido local, finder and trocar RIJ 1st pass with US guidance.  2 lumen placed over J wire. Biopatch and sterile dressing on.  Patient tolerated well.  VSS.  Jenita Seashore, MD.    Procedure Name: Intubation Date/Time: 11/23/2015 7:48 AM Performed by: Merrilyn Puma B Pre-anesthesia Checklist: Patient identified, Emergency Drugs available, Timeout performed, Suction available and Patient being monitored Patient Re-evaluated:Patient Re-evaluated prior to inductionOxygen Delivery Method: Circle system utilized Preoxygenation: Pre-oxygenation with 100% oxygen Intubation Type: IV induction Ventilation: Mask ventilation without difficulty and Oral airway inserted - appropriate to patient size Laryngoscope Size: Mac and 4 Grade View: Grade I Tube type: Oral Tube size: 8.5 mm Number of attempts: 1 Airway Equipment and Method: Stylet Placement Confirmation: CO2 detector,  positive ETCO2,  breath sounds checked- equal and bilateral and ETT inserted through vocal cords under direct vision Secured at: 23 cm Tube secured with: Tape Dental Injury: Teeth and Oropharynx as per  pre-operative assessment

## 2015-11-23 NOTE — Progress Notes (Signed)
Right CVC dc'd, DSD to site. Pt tol well.

## 2015-11-23 NOTE — Anesthesia Preprocedure Evaluation (Addendum)
Anesthesia Evaluation  Patient identified by MRN, date of birth, ID band Patient awake    Reviewed: Allergy & Precautions, NPO status   History of Anesthesia Complications Negative for: history of anesthetic complications  Airway Mallampati: II  TM Distance: >3 FB Neck ROM: Full    Dental  (+) Edentulous Upper   Pulmonary shortness of breath, COPD, former smoker,  Lung ca upper lobe    breath sounds clear to auscultation       Cardiovascular hypertension,  Rhythm:Regular Rate:Normal     Neuro/Psych    GI/Hepatic   Endo/Other    Renal/GU      Musculoskeletal   Abdominal   Peds  Hematology   Anesthesia Other Findings   Reproductive/Obstetrics                        Anesthesia Physical Anesthesia Plan  ASA: III  Anesthesia Plan: General   Post-op Pain Management:    Induction: Intravenous  Airway Management Planned: Oral ETT  Additional Equipment: Arterial line and CVP  Intra-op Plan:   Post-operative Plan: Extubation in OR and Possible Post-op intubation/ventilation  Informed Consent: I have reviewed the patients History and Physical, chart, labs and discussed the procedure including the risks, benefits and alternatives for the proposed anesthesia with the patient or authorized representative who has indicated his/her understanding and acceptance.     Plan Discussed with: CRNA and Surgeon  Anesthesia Plan Comments:         Anesthesia Quick Evaluation

## 2015-11-23 NOTE — Progress Notes (Signed)
Dr Nils Pyle here & spoke at length w/pt and spouse.

## 2015-11-23 NOTE — Transfer of Care (Signed)
Immediate Anesthesia Transfer of Care Note  Patient: Dustin Bradshaw  Procedure(s) Performed: Procedure(s): VIDEO BRONCHOSCOPY (N/A)  Patient Location: PACU  Anesthesia Type:General  Level of Consciousness: awake and alert   Airway & Oxygen Therapy: Patient Spontanous Breathing and Patient connected to face mask oxygen  Post-op Assessment: Report given to RN, Post -op Vital signs reviewed and stable and Patient moving all extremities X 4  Post vital signs: Reviewed and stable  Last Vitals:  Filed Vitals:   11/23/15 0602 11/23/15 0621  BP: 154/94 154/94  Pulse: 65 65  Temp: 35.9 C 35.9 C  Resp: 18 18    Complications: No apparent anesthesia complications

## 2015-11-23 NOTE — Anesthesia Postprocedure Evaluation (Signed)
Anesthesia Post Note  Patient: Dustin Bradshaw  Procedure(s) Performed: Procedure(s) (LRB): VIDEO BRONCHOSCOPY (N/A)  Patient location during evaluation: PACU Anesthesia Type: General Level of consciousness: awake and alert Pain management: pain level controlled Vital Signs Assessment: post-procedure vital signs reviewed and stable Respiratory status: spontaneous breathing, nonlabored ventilation, respiratory function stable and patient connected to nasal cannula oxygen Cardiovascular status: blood pressure returned to baseline and stable Postop Assessment: no signs of nausea or vomiting Anesthetic complications: no    Last Vitals:  Filed Vitals:   11/23/15 0945 11/23/15 1000  BP: 111/76 113/81  Pulse: 54 54  Temp:    Resp: 14 14    Last Pain: There were no vitals filed for this visit.               Lashanta Elbe,Diogo TERRILL

## 2015-11-24 ENCOUNTER — Encounter (HOSPITAL_COMMUNITY): Payer: Self-pay | Admitting: Cardiothoracic Surgery

## 2015-12-01 ENCOUNTER — Telehealth: Payer: Self-pay | Admitting: Internal Medicine

## 2015-12-01 NOTE — Telephone Encounter (Signed)
Rodman Key called to cxd appt for pt.

## 2015-12-03 ENCOUNTER — Encounter: Payer: Self-pay | Admitting: *Deleted

## 2015-12-03 DIAGNOSIS — C3411 Malignant neoplasm of upper lobe, right bronchus or lung: Secondary | ICD-10-CM

## 2015-12-08 ENCOUNTER — Encounter: Payer: PPO | Admitting: Surgery

## 2015-12-08 ENCOUNTER — Telehealth: Payer: Self-pay | Admitting: *Deleted

## 2015-12-08 NOTE — Telephone Encounter (Signed)
Called pt to remind him of his clinic appt tomorrow and he is requesting all appts be cancelled.  He does not want to see anyone or have anything done.  No treatments at all.  Made Elizebeth Koller aware.

## 2015-12-09 ENCOUNTER — Encounter: Payer: Self-pay | Admitting: *Deleted

## 2015-12-09 ENCOUNTER — Ambulatory Visit: Payer: PPO | Admitting: Radiation Oncology

## 2015-12-09 ENCOUNTER — Ambulatory Visit: Payer: PPO | Admitting: Internal Medicine

## 2015-12-09 ENCOUNTER — Ambulatory Visit: Payer: PPO | Admitting: Physical Therapy

## 2015-12-09 ENCOUNTER — Ambulatory Visit
Admission: RE | Admit: 2015-12-09 | Discharge: 2015-12-09 | Disposition: A | Discharge status: Home / Self Care | Payer: PPO | Source: Ambulatory Visit | Attending: Radiation Oncology | Admitting: Radiation Oncology

## 2015-12-09 ENCOUNTER — Other Ambulatory Visit: Payer: PPO

## 2015-12-09 ENCOUNTER — Telehealth: Payer: Self-pay | Admitting: *Deleted

## 2015-12-09 NOTE — Progress Notes (Signed)
Oncology Nurse Navigator Documentation  Oncology Nurse Navigator Flowsheets 12/09/2015  Navigator Encounter Type Telephone  Telephone Incoming Call/Ms. Klare called.  She is very concerned about Dustin Bradshaw.  She states he does not want treatment.  I listened as she explained.  She asked what is next.  I stated contact PCP.  She stated she cant get an appt until late April.  I stated I would call Dr. Carmie End office to help.  I called and left a vm message for Dr. Carmie End nurse to call.  I left my name and phone number.    Treatment Phase Abnormal Scans  Acuity Level 2  Time Spent with Patient 30

## 2015-12-09 NOTE — Progress Notes (Signed)
Dr. Melina Copa called to follow up with me.  I explained that patient does not wish to have any treatment nor a consultation with Dr. Julien Nordmann.  She was thankful for the update and will contact patient.

## 2015-12-09 NOTE — Telephone Encounter (Signed)
Oncology Nurse Navigator Documentation  Oncology Nurse Navigator Flowsheets 12/09/2015  Navigator Encounter Type Telephone/I called patient today to follow up on his wished on being seen today.  His wife answered and stated he does not want treatment nor be seen today.  I explained that patient's case was discussed at cancer conference today.  The treatment plan suggested was he could benefit from chemo and radiation therapy.  She again stated he did not want treatment.  I asked if patient is being followed by PCP.  I stated he needed to be followed due to location of the tumor will cause him trouble breathing at some point.  She stated she would talk with her husband about seeing PCP for follow up.  I gave her my name and phone number to call if needed.    Telephone Outgoing Call  Treatment Phase Abnormal Scans  Barriers/Navigation Needs Education;Coordination of Care  Acuity Level 2  Time Spent with Patient 30

## 2015-12-10 ENCOUNTER — Telehealth: Payer: Self-pay | Admitting: *Deleted

## 2015-12-10 NOTE — Telephone Encounter (Signed)
Oncology Nurse Navigator Documentation  Oncology Nurse Navigator Flowsheets 12/10/2015  Navigator Encounter Type Telephone/Ms. Dutton called and left me a vm message to call.  I called.  She stated her husband would like to see Dr. Julien Nordmann for consultation.  I gave her an appt on 12/27/15 arrive at 1:45.  She verbalized understanding of appt time and place.    Telephone Outgoing Call  Treatment Phase Abnormal Scans  Barriers/Navigation Needs Coordination of Care  Interventions Coordination of Care  Coordination of Care Appts  Acuity Level 1  Time Spent with Patient 15

## 2015-12-16 ENCOUNTER — Encounter: Payer: Self-pay | Admitting: *Deleted

## 2015-12-16 DIAGNOSIS — C3491 Malignant neoplasm of unspecified part of right bronchus or lung: Secondary | ICD-10-CM | POA: Diagnosis not present

## 2015-12-16 NOTE — Progress Notes (Signed)
Oncology Nurse Navigator Documentation  Oncology Nurse Navigator Flowsheets 12/16/2015  Navigator Encounter Type Telephone  Telephone Incoming Call/patient's wife called and stated he did not want to be seen by Med Onc.  She requested patient's appt be cancelled.  She stated he will be followed by PCP.  I cancelled appt and notified Dr. Julien Nordmann   Treatment Phase Abnormal Scans  Barriers/Navigation Needs Coordination of Care  Interventions Coordination of Care  Coordination of Care Appts  Acuity Level 1  Time Spent with Patient 15

## 2015-12-27 ENCOUNTER — Ambulatory Visit: Payer: PPO | Admitting: Internal Medicine

## 2015-12-27 ENCOUNTER — Other Ambulatory Visit: Payer: PPO

## 2016-01-31 ENCOUNTER — Telehealth: Payer: Self-pay | Admitting: *Deleted

## 2016-01-31 NOTE — Telephone Encounter (Signed)
Oncology Nurse Navigator Documentation  Oncology Nurse Navigator Flowsheets 01/31/2016  Navigator Encounter Type Telephone  Telephone Outgoing Call  Patient Visit Type -  Treatment Phase Other  Barriers/Navigation Needs Coordination of Care  Interventions Coordination of Care  Coordination of Care -  Acuity Level 2  Time Spent with Patient 30   I received a message from Dustin Bradshaw.  Dustin Bradshaw called and wants a second opinion.  I called and spoke with Dustin Bradshaw.  I listened as she explained she is not happy and wants a second opinion.  I explained that Dustin Bradshaw did not want to be seen and wanted Hospice.  Dr. Melina Copa spoke to Dustin Bradshaw in the past and she ordered Hospice.  Again, Dustin Bradshaw states she is not happy about how he is doing.  She would like to see Med Onc in [place].  I asked that she call Dr. Melina Copa and have her to refer him.  I stated if she needs more assistance to call.

## 2016-01-31 NOTE — Telephone Encounter (Signed)
Patient wife called stating that she would like to speak to MD [facility] about patient's care. Message forwarded to MD Welford Roche

## 2016-02-02 ENCOUNTER — Telehealth: Payer: Self-pay | Admitting: Internal Medicine

## 2016-02-02 NOTE — Telephone Encounter (Signed)
returned call and s.w. pt and sched new appt....pt ok and aware of new d.t

## 2016-02-07 ENCOUNTER — Telehealth: Payer: Self-pay | Admitting: Internal Medicine

## 2016-02-07 NOTE — Telephone Encounter (Signed)
rturned call and s.w. pt and cx appt.Marland KitchenMarland KitchenMarland KitchenMarland Kitchenpt will call back to r/s

## 2016-02-09 ENCOUNTER — Other Ambulatory Visit: Payer: PPO

## 2016-02-09 ENCOUNTER — Ambulatory Visit: Payer: PPO | Admitting: Internal Medicine

## 2016-04-03 DIAGNOSIS — I10 Essential (primary) hypertension: Secondary | ICD-10-CM | POA: Diagnosis not present

## 2016-04-03 DIAGNOSIS — E669 Obesity, unspecified: Secondary | ICD-10-CM | POA: Diagnosis not present

## 2016-04-03 DIAGNOSIS — F418 Other specified anxiety disorders: Secondary | ICD-10-CM | POA: Diagnosis not present

## 2016-04-03 DIAGNOSIS — C3491 Malignant neoplasm of unspecified part of right bronchus or lung: Secondary | ICD-10-CM | POA: Diagnosis not present

## 2016-04-07 ENCOUNTER — Encounter (HOSPITAL_BASED_OUTPATIENT_CLINIC_OR_DEPARTMENT_OTHER): Payer: PPO

## 2016-04-07 ENCOUNTER — Encounter (HOSPITAL_COMMUNITY): Payer: PPO | Attending: Oncology | Admitting: Oncology

## 2016-04-07 ENCOUNTER — Encounter (HOSPITAL_COMMUNITY): Payer: Self-pay | Admitting: Oncology

## 2016-04-07 VITALS — BP 122/83 | HR 58 | Temp 98.9°F | Resp 18 | Ht 66.0 in | Wt 222.2 lb

## 2016-04-07 DIAGNOSIS — R0609 Other forms of dyspnea: Secondary | ICD-10-CM | POA: Diagnosis not present

## 2016-04-07 DIAGNOSIS — Z808 Family history of malignant neoplasm of other organs or systems: Secondary | ICD-10-CM | POA: Diagnosis not present

## 2016-04-07 DIAGNOSIS — C3411 Malignant neoplasm of upper lobe, right bronchus or lung: Secondary | ICD-10-CM

## 2016-04-07 DIAGNOSIS — Z87891 Personal history of nicotine dependence: Secondary | ICD-10-CM | POA: Diagnosis not present

## 2016-04-07 LAB — COMPREHENSIVE METABOLIC PANEL
ALT: 27 U/L (ref 17–63)
ANION GAP: 5 (ref 5–15)
AST: 21 U/L (ref 15–41)
Albumin: 4 g/dL (ref 3.5–5.0)
Alkaline Phosphatase: 82 U/L (ref 38–126)
BUN: 25 mg/dL — ABNORMAL HIGH (ref 6–20)
CALCIUM: 9.1 mg/dL (ref 8.9–10.3)
CHLORIDE: 105 mmol/L (ref 101–111)
CO2: 29 mmol/L (ref 22–32)
CREATININE: 1.37 mg/dL — AB (ref 0.61–1.24)
GFR, EST AFRICAN AMERICAN: 56 mL/min — AB (ref >60)
GFR, EST NON AFRICAN AMERICAN: 49 mL/min — AB (ref >60)
Glucose, Bld: 97 mg/dL (ref 65–99)
Potassium: 4 mmol/L (ref 3.5–5.1)
SODIUM: 139 mmol/L (ref 135–145)
Total Bilirubin: 1.1 mg/dL (ref 0.3–1.2)
Total Protein: 7.1 g/dL (ref 6.5–8.1)

## 2016-04-07 LAB — CBC WITH DIFFERENTIAL/PLATELET
Basophils Absolute: 0 10*3/uL (ref 0.0–0.1)
Basophils Relative: 0 %
EOS ABS: 0.2 10*3/uL (ref 0.0–0.7)
EOS PCT: 2 %
HCT: 45.6 % (ref 39.0–52.0)
Hemoglobin: 15 g/dL (ref 13.0–17.0)
LYMPHS ABS: 3.1 10*3/uL (ref 0.7–4.0)
LYMPHS PCT: 31 %
MCH: 31.6 pg (ref 26.0–34.0)
MCHC: 32.9 g/dL (ref 30.0–36.0)
MCV: 96 fL (ref 78.0–100.0)
MONO ABS: 0.6 10*3/uL (ref 0.1–1.0)
MONOS PCT: 6 %
Neutro Abs: 6 10*3/uL (ref 1.7–7.7)
Neutrophils Relative %: 61 %
PLATELETS: 227 10*3/uL (ref 150–400)
RBC: 4.75 MIL/uL (ref 4.22–5.81)
RDW: 13.8 % (ref 11.5–15.5)
WBC: 10 10*3/uL (ref 4.0–10.5)

## 2016-04-07 NOTE — Patient Instructions (Signed)
[place] at [facility] Discharge Instructions  RECOMMENDATIONS MADE BY THE CONSULTANT AND ANY TEST RESULTS WILL BE SENT TO YOUR REFERRING PHYSICIAN.  We will do labs today. We will perform CT scans to evaluate your disease burden. You will return after your CT scans to discuss results.  Thank you for choosing [place] at [facility] to provide your oncology and hematology care.  To afford each patient quality time with our provider, please arrive at least 15 minutes before your scheduled appointment time.   Beginning January 23rd 2017 lab work for the [facility] will be done in the  Main lab at [facility] on 1st floor. If you have a lab appointment with the [facility] please come in thru the  Main Entrance and check in at the main information desk  You need to re-schedule your appointment should you arrive 10 or more minutes late.  We strive to give you quality time with our providers, and arriving late affects you and other patients whose appointments are after yours.  Also, if you no show three or more times for appointments you may be dismissed from the clinic at the providers discretion.     Again, thank you for choosing [facility].  Our hope is that these requests will decrease the amount of time that you wait before being seen by our physicians.       _____________________________________________________________  Should you have questions after your visit to [facility], please contact our office at [phone] between the hours of 8:30 a.m. and 4:30 p.m.  Voicemails left after 4:30 p.m. will not be returned until the following business day.  For prescription refill requests, have your pharmacy contact our office.         Resources For Cancer Patients and their Caregivers ? American Cancer Society: Can assist with transportation, wigs, general needs, runs Look Good Feel Better.         575-128-2961 ? Cancer Care: Provides financial assistance, online support groups, medication/co-pay assistance.  1-800-813-HOPE 224-234-4465) ? [facility] Assists [place] cancer patients and their families through emotional , educational and financial support.  726-579-8379 ? [place] DSS Where to apply for food stamps, Medicaid and utility assistance. 703-304-6917 ? [facility]: Transportation to medical appointments. (414)712-6853 ? Social Security Administration: May apply for disability if have a Stage IV cancer. (773)263-8694 413-105-3327 ? [employer], Disability and Transit Services: Assists with nutrition, care and transit needs. [place] Support Programs: '@10RELATIVEDAYS'$ @ > Cancer Support Group  2nd Tuesday of the month 1pm-2pm, Journey Room  > Creative Journey  3rd Tuesday of the month 1130am-1pm, Journey Room  > Look Good Feel Better  1st Wednesday of the month 10am-12 noon, Journey Room (Call [place] to register [phone])

## 2016-04-09 NOTE — Progress Notes (Signed)
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  HISTORY OF PRESENT ILLNESS:   Dustin Bradshaw is a 75 y.o. male with a medical history significant for COPD, history of tobacco abuse, enlarged prostate, pulmonary hypertension, obesity who is referred to the [facility] for a second opinion regarding his biopsy proven squamous cell carcinoma of right upper lobe of lung.  In summary, the patient underwent imaging that demonstrated a right upper lobe mass.  He was referred to pulmonology and underwent a bronchoscopy with biopsies by Dr. Lamonte Sakai on 09/16/2016.  I personally reviewed and went over pathology results with the patient.  Pathology was positive for squamous cell carcinoma.  This lead to further imaging including a PET scan that demonstrated posterior RUL 4.1 cm mass with soft tissue density extending into the apical segmental bronchus concerning for invasion of disease.  He then went on to surgical consultation with Dr. Prescott Gum.  On his review of imaging, he too was concerned about tumor invasion into the bronchus. After receiving surgical clearance, the patient was taken to the OR where Dr. Prescott Gum performed a bronchoscopy prior to planned VATS.  During bronchoscopy, the tumor was identified as invading the bronchus and therefore the VATS procedure was aborted and the patient was referred to medical oncology and radiation oncology for medical management of malignancy.  The patient declined these referral and therefore was referred to Hospice.  He recently discharged himself from [facility] secondary to no performance decline and requests a second opinion regarding his malignancy.  I personally reviewed and went over laboratory results with the  patient.  The results are noted within this dictation.  I personally reviewed and went over radiographic studies with the patient.  The results are noted within this dictation.    I personally reviewed and went over pathology results with the patient.  "I just want to know where the cancer is," the patient's wife reports.   The patient remains very active having mowed his lawn yesterday.  His performance status is excellent and he denies any complaints.  His appetite is strong.  He has maintained his weight.  He notes some SOB on exertion, but his breathing is at baseline during rest.  He denies any hemoptysis.    Review of Systems  Constitutional: Negative for fever, chills, weight loss and malaise/fatigue.  HENT: Negative.   Eyes: Negative.  Negative for blurred vision and double vision.  Respiratory: Positive for shortness of breath (on exertion only). Negative for cough, hemoptysis and wheezing.   Cardiovascular: Negative.  Negative for chest pain, palpitations, orthopnea and leg swelling.  Gastrointestinal: Negative for nausea, vomiting, diarrhea, constipation, blood in stool and melena.  Genitourinary: Negative.   Musculoskeletal: Negative.  Negative for falls.  Skin: Negative.   Neurological: Negative.  Negative for sensory change, speech change, focal weakness, seizures, loss of consciousness and weakness.  Endo/Heme/Allergies: Negative.   Psychiatric/Behavioral: Negative.      PAST MEDICAL HISTORY:   Past Medical History  Diagnosis Date  . Hypertension   . High cholesterol   . Primary cancer of right upper lobe of lung ([place]) 10/06/2015  . Shortness of breath dyspnea   . Pneumonia  ALLERGIES: No Known Allergies    MEDICATIONS: I have reviewed the patient's current medications.    Current Outpatient Prescriptions on File Prior to Visit  Medication Sig Dispense Refill  . amLODipine (NORVASC) 10 MG tablet Take 1 tablet by mouth daily.    Marland Kitchen losartan (COZAAR) 100 MG  tablet Take 1 tablet by mouth daily.    . metoprolol (TOPROL-XL) 200 MG 24 hr tablet Take 1 tablet by mouth daily.    . Multiple Vitamin (MULTIVITAMIN) tablet Take 1 tablet by mouth daily.    . simvastatin (ZOCOR) 40 MG tablet Take 1 tablet by mouth daily.     No current facility-administered medications on file prior to visit.     PAST SURGICAL HISTORY Past Surgical History  Procedure Laterality Date  . Eye surgery    . Yag laser application Left 70/62/3762    Procedure: YAG LASER APPLICATION;  Surgeon: Rutherford Guys, MD;  Location: [facility];  Service: Ophthalmology;  Laterality: Left;  . Video bronchoscopy Bilateral 09/17/2015    Procedure: VIDEO BRONCHOSCOPY WITH FLUORO;  Surgeon: Collene Gobble, MD;  Location: [place];  Service: Cardiopulmonary;  Laterality: Bilateral;  . Video bronchoscopy N/A 11/23/2015    Procedure: VIDEO BRONCHOSCOPY;  Surgeon: Ivin Poot, MD;  Location: [facility];  Service: Thoracic;  Laterality: N/A;    FAMILY HISTORY: History reviewed. No pertinent family history.  SOCIAL HISTORY:  reports that he quit smoking about 2 years ago. His smoking use included Cigarettes. He has a 30 pack-year smoking history. His smokeless tobacco use includes Chew. He reports that he does not drink alcohol or use illicit drugs.  Social History   Social History  . Marital Status: Married    Spouse Name: N/A  . Number of Children: N/A  . Years of Education: N/A   Social History Main Topics  . Smoking status: Former Smoker -- 0.50 packs/day for 60 years    Types: Cigarettes    Quit date: 03/10/2014  . Smokeless tobacco: Current User    Types: Chew  . Alcohol Use: No  . Drug Use: No  . Sexual Activity: Not Currently   Other Topics Concern  . None   Social History Narrative    PERFORMANCE STATUS: The patient's performance status is 0 - Asymptomatic  PHYSICAL EXAM: Most Recent Vital Signs: Blood pressure 122/83, pulse 58, temperature 98.9 F (37.2 C),  temperature source Oral, resp. rate 18, height '5\' 6"'$  (1.676 m), weight 222 lb 3.2 oz (100.789 kg), SpO2 97 %. BP 122/83 mmHg  Pulse 58  Temp(Src) 98.9 F (37.2 C) (Oral)  Resp 18  Ht '5\' 6"'$  (1.676 m)  Wt 222 lb 3.2 oz (100.789 kg)  BMI 35.88 kg/m2  SpO2 97%  General Appearance:    Alert, cooperative, no distress, appears stated age.  Patient is very laid back and reserved while his wife dominates the majority of conversation and she demands imagine studies.  Head:    Normocephalic, without obvious abnormality, atraumatic  Eyes:    Conjunctiva/corneas clear, EOM's intact.      Ears:    External ear anatomy is normal.  Nose:   Nares normal, mucosa normal, no drainage or sinus tenderness  Throat:   Lips, mucosa, and tongue normal.  Neck:   Supple, symmetrical, trachea midline, no adenopathy;       thyroid:  No enlargement/tenderness/nodules; no carotid   bruit or JVD  Back:     Symmetric, no curvature, ROM normal, no CVA tenderness  Lungs:  Clear to auscultation bilaterally with decreased breath sounds in the right upper lobe, respirations unlabored  Chest wall:    No tenderness or deformity  Heart:    Regular rate and rhythm, S1 and S2 normal, no murmur, rub   or gallop  Abdomen:     Soft, non-tender, bowel sounds active all four quadrants,    no masses, no organomegaly, obese        Extremities:   Extremities normal, atraumatic, no cyanosis or edema  Pulses:   2+ and symmetric all extremities  Skin:   Skin color, texture, turgor normal, no rashes or lesions  Lymph nodes:   Cervical, supraclavicular, and axillary nodes normal  Neurologic:   CNII-XII intact. Normal strength, sensation and reflexes      throughout    LABORATORY DATA:  CBC    Component Value Date/Time   WBC 10.0 04/07/2016 1539   WBC 11.6* 10/06/2015 1326   RBC 4.75 04/07/2016 1539   RBC 4.40 10/06/2015 1326   HGB 15.0 04/07/2016 1539   HGB 14.2 10/06/2015 1326   HCT 45.6 04/07/2016 1539   HCT 42.1  10/06/2015 1326   PLT 227 04/07/2016 1539   PLT 237 10/06/2015 1326   MCV 96.0 04/07/2016 1539   MCV 95.7 10/06/2015 1326   MCH 31.6 04/07/2016 1539   MCH 32.3 10/06/2015 1326   MCHC 32.9 04/07/2016 1539   MCHC 33.7 10/06/2015 1326   RDW 13.8 04/07/2016 1539   RDW 13.7 10/06/2015 1326   LYMPHSABS 3.1 04/07/2016 1539   LYMPHSABS 2.2 10/06/2015 1326   MONOABS 0.6 04/07/2016 1539   MONOABS 1.2* 10/06/2015 1326   EOSABS 0.2 04/07/2016 1539   EOSABS 0.1 10/06/2015 1326   BASOSABS 0.0 04/07/2016 1539   BASOSABS 0.0 10/06/2015 1326      Chemistry      Component Value Date/Time   NA 139 04/07/2016 1539   NA 142 10/06/2015 1327   K 4.0 04/07/2016 1539   K 3.8 10/06/2015 1327   CL 105 04/07/2016 1539   CO2 29 04/07/2016 1539   CO2 26 10/06/2015 1327   BUN 25* 04/07/2016 1539   BUN 21.4 10/06/2015 1327   CREATININE 1.37* 04/07/2016 1539   CREATININE 1.1 10/06/2015 1327      Component Value Date/Time   CALCIUM 9.1 04/07/2016 1539   CALCIUM 9.6 10/06/2015 1327   ALKPHOS 82 04/07/2016 1539   ALKPHOS 94 10/06/2015 1327   AST 21 04/07/2016 1539   AST 32 10/06/2015 1327   ALT 27 04/07/2016 1539   ALT 58* 10/06/2015 1327   BILITOT 1.1 04/07/2016 1539   BILITOT 1.12 10/06/2015 1327      RADIOGRAPHY: CLINICAL DATA: Initial treatment strategy for staging of right upper lobe lung mass.  EXAM: NUCLEAR MEDICINE PET SKULL BASE TO THIGH  TECHNIQUE: 9.8 mCi F-18 FDG was injected intravenously. Full-ring PET imaging was performed from the skull base to thigh after the radiotracer. CT data was obtained and used for attenuation correction and anatomic localization.  FASTING BLOOD GLUCOSE: Value: 103 mg/dl  COMPARISON: Chest CT of 08/12/2015. Abdominal pelvic CT 06/02/2015.  FINDINGS: NECK  No areas of abnormal hypermetabolism.  CHEST  The central right upper lobe lung mass has is enlarged, and is hypermetabolic. This measures 4.1 x 3.0 cm and a S.U.V. max of  16.2 on image 23/series 8. This is centered within the posterior segmental bronchus.  Soft tissue density extending into the apical segmental bronchus, with surrounding atelectasis, demonstrates lower level hypermetabolism, including  at a S.U.V. max of 3.8 on image 17/series 8.  No nodal hypermetabolism within the chest.  ABDOMEN/PELVIS  Both adrenal glands demonstrate moderate hypermetabolism, slightly less than liver. For example, the left adrenal measures a S.U.V. max of 4.2. minimal low-density left adrenal nodularity, favoring an adenoma. no other abnormal abdominal hypermetabolism.  SKELETON  No abnormal marrow activity.  CT IMAGES PERFORMED FOR ATTENUATION CORRECTION  No cervical adenopathy. Mild cardiomegaly, without pericardial effusion. Right coronary artery atherosclerosis. Pulmonary artery enlargement, outflow tract 3.2 cm. Development of reticular nodular opacities throughout the right upper lobe, likely due to postobstructive pneumonitis. Large hepatic cysts. Fluid density renal lesions which are also likely cysts. Abdominal aortic and branch vessel atherosclerosis. Apparent hepatic flexure colonic wall thickening, favored to be due to underdistention. Moderate prostatomegaly. Probable bone island in the right pubis.  IMPRESSION: 1. Interval enlargement of a mass centered within the posterior right upper lobe segmental bronchus, favored to represent primary bronchogenic carcinoma. 2. Soft tissue density within the right apical segmental bronchus demonstrates lower level hypermetabolism. This is suspicious for tumor extension. A portion of this could be due to bronchial obstruction and mucous impaction. 3. No thoracic nodal or extra thoracic hypermetabolic metastatic disease. Bilateral adrenal hypermetabolism is without dominant mass and favored to be physiologic. 4. Development of right upper lobe reticular nodular opacities, most consistent with  postobstructive pneumonitis. 5. Atherosclerosis, including within the coronary arteries. 6. Pulmonary artery enlargement suggests pulmonary arterial hypertension. 7. Prostatomegaly.   Electronically Signed  By: Abigail Miyamoto M.D.  On: 10/22/2015 12:04   PATHOLOGY:   Diagnosis Endobronchial biopsy, right upper lobe - NON SMALL CELL CARCINOMA. - SEE COMMENT. Microscopic Comment There is only minimal tumor present, and the features slightly favor squamous cell caricnoma. There is insufficient tissue presents for additional studies. Dr. Lamonte Sakai was paged on 09/20/2015. (JBK:gt, 09/20/15) Enid Cutter MD Pathologist, Electronic Signature (Case signed 09/20/2015)  ASSESSMENT/PLAN:   Primary cancer of right upper lobe of lung (HCC) Stage IB squamous cell carcinoma RUL measuring 4.1 cm on PET imaging in January 2017, biopsy proven on 09/17/2015 by pulmonology.  At time of surgical resection, there was concern on imaging that the mass was invading the mainstem bronchus.  This concern was expressed prior to surgery and therefore, in Feb 2017, Dr. Prescott Gum performed a bronchoscopy prior to VATS which did reveal tumor invasion of bronchus.  Therefore, further surgical intervention was aborted and the patient was referred to medical oncology and radiation oncology.  Concomitant chemoradiation was recommended, but the patient declined.  He has not had further medical oncology follow-up since Jan 2017 due to patient refusal.  He was referred to Hospice with a life expectancy of approximately 3 months.  He recently discharged himself from Hospice given he lack of decline in the past number of months and he presents to the [facility] via referral from his primary care provider, [name], for a second opinion.  "I just want to know where it is," the patient's wife reports regarding his malignancy.  [name] is very confused regarding the cancer diagnosis and lack of any  physical decline in the patient over the past number of months.    On additional discussion the patient's wife notes that their daughter has passed recently from endometrial cancer.   The patient's performance status remains excellent and he is a candidate for treatment if he were to decide to pursue treatment for this malignancy. They both seem more open to this possibility.  Orders placed for CT CAP with contrast.  Return following CT imaging to discuss results and possible options regarding treatment if the patient is interested. They are both pleased with this plan. He will return post imaging to discuss.    ORDERS PLACED FOR THIS ENCOUNTER: Orders Placed This Encounter  Procedures  . CT Chest W Contrast  . CT Abdomen Pelvis W Contrast  . CBC with Differential  . Comprehensive metabolic panel    All questions were answered. The patient knows to call the clinic with any problems, questions or concerns. We can certainly see the patient much sooner if necessary.  This note is electronically signed Reuben Likes, MD :04/09/2016 9:38 AM

## 2016-04-09 NOTE — Assessment & Plan Note (Addendum)
Stage IB squamous cell carcinoma RUL measuring 4.1 cm on PET imaging in January 2017, biopsy proven on 09/17/2015 by pulmonology.  At time of surgical resection, there was concern on imaging that the mass was invading the mainstem bronchus.  This concern was expressed prior to surgery and therefore, in Feb 2017, Dr. Prescott Gum performed a bronchoscopy prior to VATS which did reveal tumor invasion of bronchus.  Therefore, further surgical intervention was aborted and the patient was referred to medical oncology and radiation oncology.  Concomitant chemoradiation was recommended, but the patient declined.  He has not had further medical oncology follow-up since Jan 2017 due to patient refusal.  He was referred to Hospice with a life expectancy of approximately 3 months.  He recently discharged himself from Hospice given he lack of decline in the past number of months and he presents to the [facility] via referral from his primary care provider, Dustin Bradshaw, for a second opinion.  "I just want to know where it is," the patient's wife reports regarding his malignancy.  [name] is very confused regarding the cancer diagnosis and lack of any physical decline in the patient over the past number of months.    The patient's performance status remains excellent and he is a candidate for treatment if he were to decide to pursue treatment for this malignancy.  Orders placed for CT CAP with contrast.  Labs today: CBC diff, CMET.  I personally reviewed and went over laboratory results with the patient.  The results are noted within this dictation.  Renal function is appropriate for CT contrast dye.  Return following CT imaging to discuss results and possible options regarding treatment if the patient is interested.

## 2016-04-19 ENCOUNTER — Ambulatory Visit (HOSPITAL_COMMUNITY)
Admission: RE | Admit: 2016-04-19 | Discharge: 2016-04-19 | Disposition: A | Discharge status: Home / Self Care | Payer: PPO | Source: Ambulatory Visit | Attending: Oncology | Admitting: Oncology

## 2016-04-19 DIAGNOSIS — C3411 Malignant neoplasm of upper lobe, right bronchus or lung: Secondary | ICD-10-CM | POA: Insufficient documentation

## 2016-04-19 DIAGNOSIS — R937 Abnormal findings on diagnostic imaging of other parts of musculoskeletal system: Secondary | ICD-10-CM | POA: Insufficient documentation

## 2016-04-19 MED ORDER — IOPAMIDOL (ISOVUE-300) INJECTION 61%
100.0000 mL | Freq: Once | INTRAVENOUS | Status: AC | PRN
  Administered 2016-04-19: 100 mL via INTRAVENOUS

## 2016-04-20 ENCOUNTER — Encounter (HOSPITAL_COMMUNITY): Payer: PPO | Attending: Oncology | Admitting: Hematology & Oncology

## 2016-04-20 ENCOUNTER — Encounter (HOSPITAL_COMMUNITY): Payer: Self-pay | Admitting: Hematology & Oncology

## 2016-04-20 ENCOUNTER — Encounter (HOSPITAL_COMMUNITY): Payer: Self-pay | Admitting: Lab

## 2016-04-20 VITALS — BP 142/75 | HR 55 | Temp 98.8°F | Resp 18 | Wt 220.6 lb

## 2016-04-20 DIAGNOSIS — R0609 Other forms of dyspnea: Secondary | ICD-10-CM

## 2016-04-20 DIAGNOSIS — C3411 Malignant neoplasm of upper lobe, right bronchus or lung: Secondary | ICD-10-CM

## 2016-04-20 NOTE — Progress Notes (Signed)
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  HISTORY OF PRESENT ILLNESS:   Dustin Bradshaw is a 75 y.o. 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  In summary, the patient underwent imaging that demonstrated a right upper lobe mass.  He was referred to pulmonology and underwent a bronchoscopy with biopsies by Dr. Lamonte Sakai on 09/16/2016.  I personally reviewed and went over pathology results with the patient.  Pathology was positive for squamous cell carcinoma.  This lead to further imaging including a PET scan that demonstrated posterior RUL 4.1 cm mass with soft tissue density extending into the apical segmental bronchus concerning for invasion of disease.  He then went on to surgical consultation with Dr. Prescott Gum.  On his review of imaging, he too was concerned about tumor invasion into the bronchus. After receiving surgical clearance, the patient was taken to the OR where Dr. Prescott Gum performed a bronchoscopy prior to planned VATS.  During bronchoscopy, the tumor was identified as invading the bronchus and therefore the VATS procedure was aborted and the patient was referred to medical oncology and radiation oncology for medical management of malignancy.  The patient declined these referral and therefore was referred to Hospice.  He recently discharged himself from [facility] secondary to no performance decline and requests a second opinion regarding his malignancy.  The patient has undergone repeat imaging. He and his wife present  today to review. He has no new complaints. He remains active. He denies new pain. No hemoptysis.     Review of Systems  Constitutional: Negative for fever, chills, weight loss and malaise/fatigue.  HENT: Negative.   Eyes: Negative.  Negative for blurred vision and double vision.  Respiratory: Positive for shortness of breath (on exertion only). Negative for cough, hemoptysis and wheezing.   Cardiovascular: Negative.  Negative for chest pain, palpitations, orthopnea and leg swelling.  Gastrointestinal: Negative for nausea, vomiting, diarrhea, constipation, blood in stool and melena.  Genitourinary: Negative.   Musculoskeletal: Negative.  Negative for falls.  Skin: Negative.   Neurological: Negative.  Negative for sensory change, speech change, focal weakness, seizures, loss of consciousness and weakness.  Endo/Heme/Allergies: Negative.   Psychiatric/Behavioral: Negative.    14 point review of systems was performed and is negative except as detailed under history of present illness and above    PAST MEDICAL HISTORY:   Past Medical History  Diagnosis Date  . Hypertension   . High cholesterol   . Primary cancer of right upper lobe of lung ([place]) 10/06/2015  . Shortness of breath dyspnea   . Pneumonia     ALLERGIES: No Known Allergies    MEDICATIONS: I have reviewed the patient's current medications.    Current Outpatient Prescriptions on File Prior to Visit  Medication Sig Dispense Refill  . amLODipine (NORVASC) 10 MG tablet Take 1 tablet by mouth daily.    Marland Kitchen losartan (COZAAR) 100 MG tablet Take 1 tablet by mouth daily.    . metoprolol (TOPROL-XL) 200 MG 24 hr  tablet Take 1 tablet by mouth daily.    . Multiple Vitamin (MULTIVITAMIN) tablet Take 1 tablet by mouth daily.    . simvastatin (ZOCOR) 40 MG tablet Take 1 tablet by mouth daily.     No current facility-administered medications on file prior to visit.     PAST SURGICAL HISTORY Past Surgical History  Procedure  Laterality Date  . Eye surgery    . Yag laser application Left 85/46/2703    Procedure: YAG LASER APPLICATION;  Surgeon: Rutherford Guys, MD;  Location: [facility];  Service: Ophthalmology;  Laterality: Left;  . Video bronchoscopy Bilateral 09/17/2015    Procedure: VIDEO BRONCHOSCOPY WITH FLUORO;  Surgeon: Collene Gobble, MD;  Location: [place];  Service: Cardiopulmonary;  Laterality: Bilateral;  . Video bronchoscopy N/A 11/23/2015    Procedure: VIDEO BRONCHOSCOPY;  Surgeon: Ivin Poot, MD;  Location: [place];  Service: Thoracic;  Laterality: N/A;    FAMILY HISTORY: History reviewed. No pertinent family history.  SOCIAL HISTORY:  reports that he quit smoking about 2 years ago. His smoking use included Cigarettes. He has a 30 pack-year smoking history. His smokeless tobacco use includes Chew. He reports that he does not drink alcohol or use illicit drugs.  Social History   Social History  . Marital Status: Married    Spouse Name: N/A  . Number of Children: N/A  . Years of Education: N/A   Social History Main Topics  . Smoking status: Former Smoker -- 0.50 packs/day for 60 years    Types: Cigarettes    Quit date: 03/10/2014  . Smokeless tobacco: Current User    Types: Chew  . Alcohol Use: No  . Drug Use: No  . Sexual Activity: Not Currently   Other Topics Concern  . None   Social History Narrative    PERFORMANCE STATUS: The patient's performance status is 0 - Asymptomatic  PHYSICAL EXAM: Most Recent Vital Signs: Blood pressure 142/75, pulse 55, temperature 98.8 F (37.1 C), temperature source Oral, resp. rate 18, weight 220 lb 9.6 oz (100.064 kg), SpO2 99 %. BP 142/75 mmHg  Pulse 55  Temp(Src) 98.8 F (37.1 C) (Oral)  Resp 18  Wt 220 lb 9.6 oz (100.064 kg)  SpO2 99%  General Appearance:    Alert, cooperative, no distress, appears stated age.  Patient is very laid back and reserved while his wife dominates the majority of conversation and she demands imagine studies.   Head:    Normocephalic, without obvious abnormality, atraumatic  Eyes:    Conjunctiva/corneas clear, EOM's intact.      Ears:    External ear anatomy is normal.  Nose:   Nares normal, mucosa normal, no drainage or sinus tenderness  Throat:   Lips, mucosa, and tongue normal.  Neck:   Supple, symmetrical, trachea midline, no adenopathy;       thyroid:  No enlargement/tenderness/nodules; no carotid   bruit or JVD  Back:     Symmetric, no curvature, ROM normal, no CVA tenderness  Lungs:     Clear to auscultation bilaterally with decreased breath sounds in the right upper lobe, respirations unlabored  Chest wall:    No tenderness or deformity  Heart:    Regular rate and rhythm, S1 and S2 normal, no murmur, rub   or gallop  Abdomen:     Soft, non-tender, bowel sounds active all four quadrants,    no masses, no organomegaly, obese        Extremities:   Extremities normal, atraumatic,  no cyanosis or edema  Pulses:   2+ and symmetric all extremities  Skin:   Skin color, texture, turgor normal, no rashes or lesions  Lymph nodes:   Cervical, supraclavicular, and axillary nodes normal  Neurologic:   CNII-XII intact. Normal strength, sensation and reflexes      throughout    LABORATORY DATA:  I have reviewed the data as listed.  CBC    Component Value Date/Time   WBC 10.0 04/07/2016 1539   WBC 11.6* 10/06/2015 1326   RBC 4.75 04/07/2016 1539   RBC 4.40 10/06/2015 1326   HGB 15.0 04/07/2016 1539   HGB 14.2 10/06/2015 1326   HCT 45.6 04/07/2016 1539   HCT 42.1 10/06/2015 1326   PLT 227 04/07/2016 1539   PLT 237 10/06/2015 1326   MCV 96.0 04/07/2016 1539   MCV 95.7 10/06/2015 1326   MCH 31.6 04/07/2016 1539   MCH 32.3 10/06/2015 1326   MCHC 32.9 04/07/2016 1539   MCHC 33.7 10/06/2015 1326   RDW 13.8 04/07/2016 1539   RDW 13.7 10/06/2015 1326   LYMPHSABS 3.1 04/07/2016 1539   LYMPHSABS 2.2 10/06/2015 1326   MONOABS 0.6 04/07/2016 1539   MONOABS 1.2* 10/06/2015 1326   EOSABS 0.2  04/07/2016 1539   EOSABS 0.1 10/06/2015 1326   BASOSABS 0.0 04/07/2016 1539   BASOSABS 0.0 10/06/2015 1326      Chemistry      Component Value Date/Time   NA 139 04/07/2016 1539   NA 142 10/06/2015 1327   K 4.0 04/07/2016 1539   K 3.8 10/06/2015 1327   CL 105 04/07/2016 1539   CO2 29 04/07/2016 1539   CO2 26 10/06/2015 1327   BUN 25* 04/07/2016 1539   BUN 21.4 10/06/2015 1327   CREATININE 1.37* 04/07/2016 1539   CREATININE 1.1 10/06/2015 1327      Component Value Date/Time   CALCIUM 9.1 04/07/2016 1539   CALCIUM 9.6 10/06/2015 1327   ALKPHOS 82 04/07/2016 1539   ALKPHOS 94 10/06/2015 1327   AST 21 04/07/2016 1539   AST 32 10/06/2015 1327   ALT 27 04/07/2016 1539   ALT 58* 10/06/2015 1327   BILITOT 1.1 04/07/2016 1539   BILITOT 1.12 10/06/2015 1327      RADIOGRAPHY: I have reviewed the imaging below.   Study Result     CLINICAL DATA: Followup lung cancer. RIGHT upper lobe squamous cell carcinoma.  EXAM: CT CHEST, ABDOMEN, AND PELVIS WITH CONTRAST  TECHNIQUE: Multidetector CT imaging of the chest, abdomen and pelvis was performed following the standard protocol during bolus administration of intravenous contrast.  CONTRAST: 165m ISOVUE-300 IOPAMIDOL (ISOVUE-300) INJECTION 61%  COMPARISON: PET-CT 10/22/2015, CT 08/12/2015.  FINDINGS: CT CHEST FINDINGS  Mediastinum/Nodes: No axillary or supraclavicular lymphadenopathy. No mediastinal or hilar lymphadenopathy. No pericardial fluid.  Lungs/Pleura: The RIGHT perihilar mass has increased in the interval measuring 5.8 x 5.3 cm compared to 3.2 x 2.8 cm on comparison PET-CT of 10/22/2015. The mass invades the RIGHT upper lobe bronchus and RIGHT middle lobe bronchus.  Postobstructive atelectasis in the RIGHT upper lobe and RIGHT middle lobe.  Noncalcified 7 mm nodule in RIGHT middle lobe compares to 7 mm on prior CT.  Musculoskeletal: Sclerotic focus in the T7 vertebral body (image  76, series 6) present on comparison exams and had minimal metabolic activity. No change in size from 08/12/2015  CT ABDOMEN AND PELVIS FINDINGS  Hepatobiliary: Large hepatic cysts again noted. Normal gallbladder.  Pancreas: Pancreas is normal. No ductal dilatation. No pancreatic inflammation.  Spleen: Normal spleen  Adrenals/urinary tract: Nodule of the LEFT adrenal gland measuring 10 mm not changed significantly. Bilateral renal cysts which have simple fluid attenuation.  Stomach/Bowel: Stomach, small bowel, appendix, and cecum are normal. The colon and rectosigmoid colon are normal.  Vascular/Lymphatic: Abdominal aorta is normal caliber with atherosclerotic calcification. There is no retroperitoneal or periportal lymphadenopathy. No pelvic lymphadenopathy.  Reproductive: Prostate enlarged  Other: No free fluid.  Musculoskeletal: Sclerotic lesion in the RIGHT pubic symphysis.  IMPRESSION: 1. Interval significant increase in size of RIGHT hilar mass which obstructs the RIGHT upper lobe and RIGHT middle lobe bronchus. 2. No evidence of distant soft tissue metastasis. 3. Rounded sclerotic lesion in the T7 vertebral body not unchanged in size from 08/12/2015 and with minimal uptake on FDG scan favors a benign lesion. Recommend attention on follow-up.   Electronically Signed  By: Suzy Bouchard M.D.  On: 04/19/2016 18:26     PATHOLOGY:   Diagnosis Endobronchial biopsy, right upper lobe - NON SMALL CELL CARCINOMA. - SEE COMMENT. Microscopic Comment There is only minimal tumor present, and the features slightly favor squamous cell caricnoma. There is insufficient tissue presents for additional studies. Dr. Lamonte Sakai was paged on 09/20/2015. (JBK:gt, 09/20/15) Enid Cutter MD Pathologist, Electronic Signature (Case signed 09/20/2015)    ASSESSMENT/PLAN:  Stage IB squamous cell carcinoma of the RUL 10/2015 Excellent PS Chest CT 04/19/2016 with  significant increase in R hilar mass   The patient and his wife and I spent time reviewing his CT imaging in detail. We discussed multiple options. At this point they are interested in pursuing therapy, chemotherapy is not an option they are particularly fond of although not unwilling to consider. His PS is very good and I believe he would do quite well. Given however that the mass is obstructing the RU and RM lobe bronchus and he has some worsening dyspnea with exertion they have opted for radiation oncology referral.    There was insufficient tissue for additional studies with his original pathology. PD L1 testing would be beneficial. Can address in future if needed.  Will refer to [facility] for XRT. Plan is for follow-up here in several weeks.  All questions were answered. The patient knows to call the clinic with any problems, questions or concerns. We can certainly see the patient much sooner if necessary.  This document serves as a record of services personally performed by Ancil Linsey, MD. It was created on her behalf by Toni Amend, a trained medical scribe. The creation of this record is based on the scribe's personal observations and the provider's statements to them. This document has been checked and approved by the attending provider.  I have reviewed the above documentation for accuracy and completeness and I agree with the above.  This note is electronically signed JS:[email or web address],CHYIFOY Cyril Mourning, MD  04/20/2016 4:45 PM

## 2016-04-20 NOTE — Progress Notes (Signed)
Referral sent to [facility].  Records faxed on 7/13.

## 2016-04-20 NOTE — Patient Instructions (Signed)
[place] at [facility] Discharge Instructions  RECOMMENDATIONS MADE BY THE CONSULTANT AND ANY TEST RESULTS WILL BE SENT TO YOUR REFERRING PHYSICIAN.  We will refer you to [facility] to Radiation Oncology I will see you back one week after you speak to a radiation oncologist We reviewed your CT scans today which mainly showed growth of the tumor in the R lung  Thank you for choosing [place] at [facility] to provide your oncology and hematology care.  To afford each patient quality time with our provider, please arrive at least 15 minutes before your scheduled appointment time.   Beginning January 23rd 2017 lab work for the [facility] will be done in the  Main lab at [facility] on 1st floor. If you have a lab appointment with the [facility] please come in thru the  Main Entrance and check in at the main information desk  You need to re-schedule your appointment should you arrive 10 or more minutes late.  We strive to give you quality time with our providers, and arriving late affects you and other patients whose appointments are after yours.  Also, if you no show three or more times for appointments you may be dismissed from the clinic at the providers discretion.     Again, thank you for choosing [facility].  Our hope is that these requests will decrease the amount of time that you wait before being seen by our physicians.       _____________________________________________________________  Should you have questions after your visit to [facility], please contact our office at [phone] between the hours of 8:30 a.m. and 4:30 p.m.  Voicemails left after 4:30 p.m. will not be returned until the following business day.  For prescription refill requests, have your pharmacy contact our office.         Resources For Cancer Patients and their Caregivers ? American Cancer Society: Can assist with transportation,  wigs, general needs, runs Look Good Feel Better.        254 863 5830 ? Cancer Care: Provides financial assistance, online support groups, medication/co-pay assistance.  1-800-813-HOPE 820-562-2032) ? [place] Assists [place] cancer patients and their families through emotional , educational and financial support.  313-684-0912 ? [place] DSS Where to apply for food stamps, Medicaid and utility assistance. 785 006 7663 ? [facility]: Transportation to medical appointments. 712-551-6874 ? Social Security Administration: May apply for disability if have a Stage IV cancer. 561-839-8424 (305)330-4944 ? [employer], Disability and Transit Services: Assists with nutrition, care and transit needs. [place] Support Programs: '@10RELATIVEDAYS'$ @ > Cancer Support Group  2nd Tuesday of the month 1pm-2pm, Journey Room  > Creative Journey  3rd Tuesday of the month 1130am-1pm, Journey Room  > Look Good Feel Better  1st Wednesday of the month 10am-12 noon, Journey Room (Call [place] to register [phone])

## 2016-04-26 ENCOUNTER — Ambulatory Visit (HOSPITAL_COMMUNITY): Payer: PPO | Admitting: Hematology & Oncology

## 2016-04-28 DIAGNOSIS — Z87891 Personal history of nicotine dependence: Secondary | ICD-10-CM | POA: Diagnosis not present

## 2016-04-28 DIAGNOSIS — I10 Essential (primary) hypertension: Secondary | ICD-10-CM | POA: Diagnosis not present

## 2016-04-28 DIAGNOSIS — Z79899 Other long term (current) drug therapy: Secondary | ICD-10-CM | POA: Diagnosis not present

## 2016-04-28 DIAGNOSIS — Z809 Family history of malignant neoplasm, unspecified: Secondary | ICD-10-CM | POA: Diagnosis not present

## 2016-04-28 DIAGNOSIS — Z510 Encounter for antineoplastic radiation therapy: Secondary | ICD-10-CM | POA: Diagnosis not present

## 2016-04-28 DIAGNOSIS — C3411 Malignant neoplasm of upper lobe, right bronchus or lung: Secondary | ICD-10-CM | POA: Diagnosis not present

## 2016-04-28 DIAGNOSIS — E7800 Pure hypercholesterolemia, unspecified: Secondary | ICD-10-CM | POA: Diagnosis not present

## 2016-05-03 DIAGNOSIS — C3411 Malignant neoplasm of upper lobe, right bronchus or lung: Secondary | ICD-10-CM | POA: Diagnosis not present

## 2016-05-04 DIAGNOSIS — C3411 Malignant neoplasm of upper lobe, right bronchus or lung: Secondary | ICD-10-CM | POA: Diagnosis not present

## 2016-05-07 ENCOUNTER — Encounter (HOSPITAL_COMMUNITY): Payer: Self-pay | Admitting: Oncology

## 2016-05-09 DIAGNOSIS — Z510 Encounter for antineoplastic radiation therapy: Secondary | ICD-10-CM | POA: Diagnosis not present

## 2016-05-09 DIAGNOSIS — C3411 Malignant neoplasm of upper lobe, right bronchus or lung: Secondary | ICD-10-CM | POA: Diagnosis not present

## 2016-05-10 ENCOUNTER — Ambulatory Visit (HOSPITAL_COMMUNITY): Payer: PPO | Admitting: Hematology & Oncology

## 2016-05-11 DIAGNOSIS — C3411 Malignant neoplasm of upper lobe, right bronchus or lung: Secondary | ICD-10-CM | POA: Diagnosis not present

## 2016-05-18 DIAGNOSIS — C3411 Malignant neoplasm of upper lobe, right bronchus or lung: Secondary | ICD-10-CM | POA: Diagnosis not present

## 2016-05-20 ENCOUNTER — Encounter (HOSPITAL_COMMUNITY): Payer: Self-pay | Admitting: Hematology & Oncology

## 2016-05-27 NOTE — Progress Notes (Signed)
This encounter was created in error - please disregard.

## 2016-06-26 ENCOUNTER — Other Ambulatory Visit: Payer: Self-pay | Admitting: Radiation Oncology

## 2016-06-26 DIAGNOSIS — C3411 Malignant neoplasm of upper lobe, right bronchus or lung: Secondary | ICD-10-CM | POA: Diagnosis not present

## 2016-06-26 DIAGNOSIS — Z923 Personal history of irradiation: Secondary | ICD-10-CM | POA: Diagnosis not present

## 2016-06-29 ENCOUNTER — Ambulatory Visit (HOSPITAL_COMMUNITY)
Admission: RE | Admit: 2016-06-29 | Discharge: 2016-06-29 | Disposition: A | Discharge status: Home / Self Care | Payer: PPO | Source: Ambulatory Visit | Attending: Radiation Oncology | Admitting: Radiation Oncology

## 2016-06-29 DIAGNOSIS — J9811 Atelectasis: Secondary | ICD-10-CM | POA: Insufficient documentation

## 2016-06-29 DIAGNOSIS — C3411 Malignant neoplasm of upper lobe, right bronchus or lung: Secondary | ICD-10-CM | POA: Insufficient documentation

## 2016-06-29 DIAGNOSIS — C3490 Malignant neoplasm of unspecified part of unspecified bronchus or lung: Secondary | ICD-10-CM | POA: Diagnosis not present

## 2016-06-29 LAB — POCT I-STAT CREATININE: CREATININE: 1.2 mg/dL (ref 0.61–1.24)

## 2016-06-29 MED ORDER — IOPAMIDOL (ISOVUE-300) INJECTION 61%
75.0000 mL | Freq: Once | INTRAVENOUS | Status: AC | PRN
  Administered 2016-06-29: 75 mL via INTRAVENOUS

## 2016-07-03 DIAGNOSIS — Z79899 Other long term (current) drug therapy: Secondary | ICD-10-CM | POA: Diagnosis not present

## 2016-07-03 DIAGNOSIS — E784 Other hyperlipidemia: Secondary | ICD-10-CM | POA: Diagnosis not present

## 2016-07-03 DIAGNOSIS — I10 Essential (primary) hypertension: Secondary | ICD-10-CM | POA: Diagnosis not present

## 2016-07-03 DIAGNOSIS — E669 Obesity, unspecified: Secondary | ICD-10-CM | POA: Diagnosis not present

## 2016-07-07 DIAGNOSIS — C3411 Malignant neoplasm of upper lobe, right bronchus or lung: Secondary | ICD-10-CM | POA: Diagnosis not present

## 2016-07-11 DIAGNOSIS — Z510 Encounter for antineoplastic radiation therapy: Secondary | ICD-10-CM | POA: Diagnosis not present

## 2016-07-11 DIAGNOSIS — C3411 Malignant neoplasm of upper lobe, right bronchus or lung: Secondary | ICD-10-CM | POA: Diagnosis not present

## 2016-07-12 DIAGNOSIS — C3411 Malignant neoplasm of upper lobe, right bronchus or lung: Secondary | ICD-10-CM | POA: Diagnosis not present

## 2016-07-14 DIAGNOSIS — C3411 Malignant neoplasm of upper lobe, right bronchus or lung: Secondary | ICD-10-CM | POA: Diagnosis not present

## 2016-07-17 DIAGNOSIS — C3411 Malignant neoplasm of upper lobe, right bronchus or lung: Secondary | ICD-10-CM | POA: Diagnosis not present

## 2016-07-24 DIAGNOSIS — C3411 Malignant neoplasm of upper lobe, right bronchus or lung: Secondary | ICD-10-CM | POA: Diagnosis not present

## 2016-08-28 DIAGNOSIS — C3411 Malignant neoplasm of upper lobe, right bronchus or lung: Secondary | ICD-10-CM | POA: Diagnosis not present

## 2016-08-28 DIAGNOSIS — Z923 Personal history of irradiation: Secondary | ICD-10-CM | POA: Diagnosis not present

## 2016-09-11 ENCOUNTER — Encounter: Payer: Self-pay | Admitting: Physician Assistant

## 2016-09-11 ENCOUNTER — Ambulatory Visit (INDEPENDENT_AMBULATORY_CARE_PROVIDER_SITE_OTHER): Payer: PPO | Admitting: Physician Assistant

## 2016-09-11 ENCOUNTER — Encounter (INDEPENDENT_AMBULATORY_CARE_PROVIDER_SITE_OTHER): Payer: Self-pay

## 2016-09-11 VITALS — BP 138/90 | HR 63 | Temp 97.7°F | Ht 66.0 in | Wt 212.0 lb

## 2016-09-11 DIAGNOSIS — C3411 Malignant neoplasm of upper lobe, right bronchus or lung: Secondary | ICD-10-CM | POA: Diagnosis not present

## 2016-09-11 DIAGNOSIS — J438 Other emphysema: Secondary | ICD-10-CM

## 2016-09-11 DIAGNOSIS — I10 Essential (primary) hypertension: Secondary | ICD-10-CM | POA: Diagnosis not present

## 2016-09-11 MED ORDER — LORAZEPAM 0.5 MG PO TABS: 0.5000 mg | ORAL_TABLET | ORAL | 5 refills | Status: DC | PRN

## 2016-09-11 MED ORDER — METOPROLOL SUCCINATE ER 200 MG PO TB24: 200.0000 mg | ORAL_TABLET | Freq: Every day | ORAL | 3 refills | Status: DC

## 2016-09-11 MED ORDER — AMLODIPINE BESYLATE 10 MG PO TABS: 10.0000 mg | ORAL_TABLET | Freq: Every day | ORAL | 3 refills | Status: DC

## 2016-09-11 MED ORDER — LOSARTAN POTASSIUM 100 MG PO TABS: 100.0000 mg | ORAL_TABLET | Freq: Every day | ORAL | 3 refills | Status: DC

## 2016-09-11 NOTE — Patient Instructions (Signed)
Hypertension Hypertension is another name for high blood pressure. High blood pressure forces your heart to work harder to pump blood. A blood pressure reading has two numbers, which includes a higher number over a lower number (example: 110/72). Follow these instructions at home:  Have your blood pressure rechecked by your doctor.  Only take medicine as told by your doctor. Follow the directions carefully. The medicine does not work as well if you skip doses. Skipping doses also puts you at risk for problems.  Do not smoke.  Monitor your blood pressure at home as told by your doctor. Contact a doctor if:  You think you are having a reaction to the medicine you are taking.  You have repeat headaches or feel dizzy.  You have puffiness (swelling) in your ankles.  You have trouble with your vision. Get help right away if:  You get a very bad headache and are confused.  You feel weak, numb, or faint.  You get chest or belly (abdominal) pain.  You throw up (vomit).  You cannot breathe very well. This information is not intended to replace advice given to you by your health care provider. Make sure you discuss any questions you have with your health care provider. Document Released: 03/13/2008 Document Revised: 03/02/2016 Document Reviewed: 07/18/2013 Elsevier Interactive Patient Education  2017 Elsevier Inc.  

## 2016-09-12 IMAGING — PT NM PET TUM IMG INITIAL (PI) SKULL BASE T - THIGH
1 of 8 series · 2 of 25 positions shown · non-contrast
Comparison: Chest CT of 08/12/2015. Abdominal pelvic CT 06/02/2015.

CLINICAL DATA: Initial treatment strategy for staging of right
upper lobe lung mass..

EXAM:
NUCLEAR MEDICINE PET SKULL BASE TO THIGH
TECHNIQUE: 9.8 mCi F-18 FDG was injected intravenously. Full-ring PET imaging
was performed from the skull base to thigh after the radiotracer. CT
data was obtained and used for attenuation correction and anatomic
localization.
FASTING BLOOD GLUCOSE:  Value: 103 mg/dl

[Series 4: ct sk_thigh 5.0 b31f · axial · 5.0mm · 0.98mm/px · z∈[-889,-25]mm · 2 of 217 slices shown]
[im 1/217  brain]
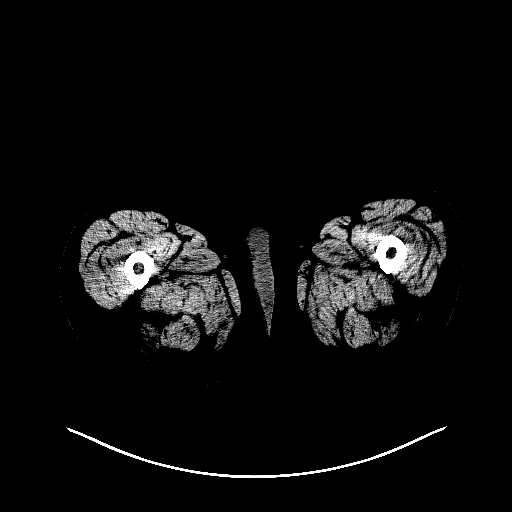
[im 217/217  brain]
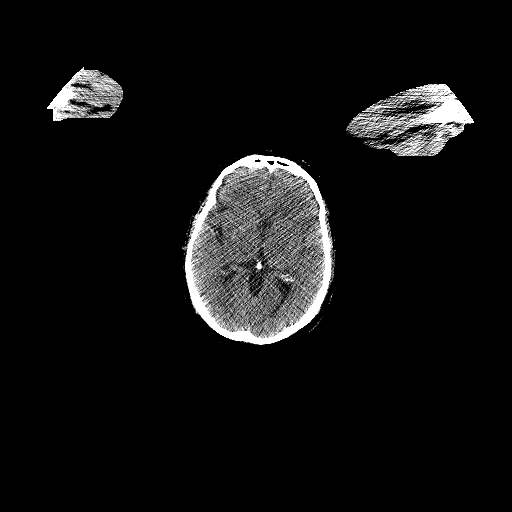

[2 of 25 positions shown; findings below may reference images not displayed]

FINDINGS: NECK

No areas of abnormal hypermetabolism.

CHEST

The central right upper lobe lung mass has is enlarged, and is
hypermetabolic. This measures 4.1 x 3.0 cm and a S.U.V. max of
on image 23/series 8. This is centered within the posterior
segmental bronchus.

Soft tissue density extending into the apical segmental bronchus,
with surrounding atelectasis, demonstrates lower level
hypermetabolism, including at a S.U.V. max of 3.8 on image 17/series
8.

No nodal hypermetabolism within the chest.

ABDOMEN/PELVIS

Both adrenal glands demonstrate moderate hypermetabolism, slightly
less than liver. For example, the left adrenal measures a S.U.V. max
of 4.2. minimal low-density left adrenal nodularity, favoring an
adenoma. no other abnormal abdominal hypermetabolism.

SKELETON

No abnormal marrow activity.

CT IMAGES PERFORMED FOR ATTENUATION CORRECTION

No cervical adenopathy. Mild cardiomegaly, without pericardial
effusion. Right coronary artery atherosclerosis. Pulmonary artery
enlargement, outflow tract 3.2 cm. Development of reticular nodular
opacities throughout the right upper lobe, likely due to
postobstructive pneumonitis. Large hepatic cysts. Fluid density
renal lesions which are also likely cysts. Abdominal aortic and
branch vessel atherosclerosis. Apparent hepatic flexure colonic wall
thickening, favored to be due to underdistention. Moderate
prostatomegaly. Probable bone island in the right pubis.
IMPRESSION: 1. Interval enlargement of a mass centered within the posterior
right upper lobe segmental bronchus, favored to represent primary
bronchogenic carcinoma.
2. Soft tissue density within the right apical segmental bronchus
demonstrates lower level hypermetabolism. This is suspicious for
tumor extension. A portion of this could be due to bronchial
obstruction and mucous impaction.
3. No thoracic nodal or extra thoracic hypermetabolic metastatic
disease. Bilateral adrenal hypermetabolism is without dominant mass
and favored to be physiologic.
4. Development of right upper lobe reticular nodular opacities, most
consistent with postobstructive pneumonitis.
5.  Atherosclerosis, including within the coronary arteries.
6. Pulmonary artery enlargement suggests pulmonary arterial
hypertension.
7. Prostatomegaly.

## 2016-09-12 NOTE — Progress Notes (Signed)
BP 138/90 (BP Location: Left Arm, Patient Position: Sitting, Cuff Size: Normal)   Pulse 63   Temp 97.7 F (36.5 C) (Oral)   Ht '5\' 6"'$  (1.676 m)   Wt 212 lb (96.2 kg)   BMI 34.22 kg/m    Subjective:    Patient ID: Dustin Bradshaw, male    DOB: 1941-01-12, 75 y.o.   MRN: [id number]  Dustin Bradshaw is a 75 y.o. male presenting on 09/11/2016 for Establish Care  HPI this patient is here to establish care. He has multiple medical problems. Has hypertension, lung cancer, anxiety at times. He is currently in the care of [facility].  He reports he gets radiation therapy at [facility]. He has been able to keep his cancer at [place] with this treatment. He will be seen Dr. Whitney Muse in coming weeks.  We will ask for his records to be transferred from [facility].  Past Medical History:  Diagnosis Date  . High cholesterol   . Hypertension   . Pneumonia   . Primary cancer of right upper lobe of lung ([place]) 10/06/2015  . Shortness of breath dyspnea    Relevant past medical, surgical, family and social history reviewed and updated as indicated. Interim medical history since our last visit reviewed. Allergies and medications reviewed and updated.   Data reviewed from any sources in EPIC.  Review of Systems  Constitutional: Negative.  Negative for appetite change and fatigue.  HENT: Negative.   Eyes: Negative.  Negative for pain and visual disturbance.  Respiratory: Negative.  Negative for cough, chest tightness, shortness of breath and wheezing.   Cardiovascular: Negative.  Negative for chest pain, palpitations and leg swelling.  Gastrointestinal: Negative.  Negative for abdominal pain, diarrhea, nausea and vomiting.  Endocrine: Negative.   Genitourinary: Negative.   Musculoskeletal: Negative.   Skin: Negative.  Negative for color change and rash.  Neurological: Negative.  Negative for weakness, numbness and headaches.  Psychiatric/Behavioral: Negative.       Social History   Social History  . Marital status: Married    Spouse name: N/A  . Number of children: N/A  . Years of education: N/A   Occupational History  . Not on file.   Social History Main Topics  . Smoking status: Former Smoker    Packs/day: 0.50    Years: 60.00    Types: Cigarettes    Quit date: 03/10/2014  . Smokeless tobacco: Current User    Types: Chew  . Alcohol use No  . Drug use: No  . Sexual activity: Not Currently   Other Topics Concern  . Not on file   Social History Narrative  . No narrative on file    Past Surgical History:  Procedure Laterality Date  . EYE SURGERY    . VIDEO BRONCHOSCOPY Bilateral 09/17/2015   Procedure: VIDEO BRONCHOSCOPY WITH FLUORO;  Surgeon: Collene Gobble, MD;  Location: [place];  Service: Cardiopulmonary;  Laterality: Bilateral;  . VIDEO BRONCHOSCOPY N/A 11/23/2015   Procedure: VIDEO BRONCHOSCOPY;  Surgeon: Ivin Poot, MD;  Location: [place];  Service: Thoracic;  Laterality: N/A;  . YAG LASER APPLICATION Left 26/83/4196   Procedure: YAG LASER APPLICATION;  Surgeon: Rutherford Guys, MD;  Location: [facility];  Service: Ophthalmology;  Laterality: Left;    History reviewed. No pertinent family history.    Medication List       Accurate as of 09/11/16 11:59 PM. Always use your most recent med list.  amLODipine 10 MG tablet Commonly known as:  NORVASC Take 1 tablet (10 mg total) by mouth daily.   co-enzyme Q-10 30 MG capsule Take 30 mg by mouth 3 (three) times daily.   LORazepam 0.5 MG tablet Commonly known as:  ATIVAN Take 1 tablet (0.5 mg total) by mouth as needed.   losartan 100 MG tablet Commonly known as:  COZAAR Take 1 tablet (100 mg total) by mouth daily.   metoprolol 200 MG 24 hr tablet Commonly known as:  TOPROL-XL Take 1 tablet (200 mg total) by mouth daily.   multivitamin tablet Take 1 tablet by mouth daily.          Objective:    BP 138/90 (BP Location: Left Arm, Patient  Position: Sitting, Cuff Size: Normal)   Pulse 63   Temp 97.7 F (36.5 C) (Oral)   Ht '5\' 6"'$  (1.676 m)   Wt 212 lb (96.2 kg)   BMI 34.22 kg/m   No Known Allergies Wt Readings from Last 3 Encounters:  09/11/16 212 lb (96.2 kg)  04/20/16 220 lb 9.6 oz (100.1 kg)  04/07/16 222 lb 3.2 oz (100.8 kg)    Physical Exam  Constitutional: He appears well-developed and well-nourished.  HENT:  Head: Normocephalic and atraumatic.  Eyes: Conjunctivae and EOM are normal. Pupils are equal, round, and reactive to light.  Neck: Normal range of motion. Neck supple.  Cardiovascular: Normal rate, regular rhythm and normal heart sounds.   Pulmonary/Chest: Effort normal and breath sounds normal.  Abdominal: Soft. Bowel sounds are normal.  Musculoskeletal: Normal range of motion.  Skin: Skin is warm and dry.        Assessment & Plan:   1. Primary cancer of right upper lobe of lung [facility]) Continue oncology therapy.  2. Other emphysema ([place]) Return to clinic if any worsening of symptoms  3. Essential hypertension - amLODipine (NORVASC) 10 MG tablet; Take 1 tablet (10 mg total) by mouth daily.  Dispense: 90 tablet; Refill: 3 - losartan (COZAAR) 100 MG tablet; Take 1 tablet (100 mg total) by mouth daily.  Dispense: 90 tablet; Refill: 3 - metoprolol (TOPROL-XL) 200 MG 24 hr tablet; Take 1 tablet (200 mg total) by mouth daily.  Dispense: 90 tablet; Refill: 3   Continue all other maintenance medications as listed above. Educational handout given for hypertension  Follow up plan: Return in about 3 months (around 12/10/2016).  Terald Sleeper PA-C [facility] [address]  [place], [place] [address] [phone]   09/12/2016, 8:58 PM

## 2016-10-30 DIAGNOSIS — C3411 Malignant neoplasm of upper lobe, right bronchus or lung: Secondary | ICD-10-CM | POA: Diagnosis not present

## 2016-10-30 DIAGNOSIS — Z923 Personal history of irradiation: Secondary | ICD-10-CM | POA: Diagnosis not present

## 2016-10-30 DIAGNOSIS — J90 Pleural effusion, not elsewhere classified: Secondary | ICD-10-CM | POA: Diagnosis not present

## 2016-11-02 ENCOUNTER — Encounter (HOSPITAL_COMMUNITY): Payer: Self-pay | Admitting: Hematology & Oncology

## 2016-11-02 ENCOUNTER — Encounter (HOSPITAL_COMMUNITY): Payer: PPO | Attending: Hematology & Oncology | Admitting: Hematology & Oncology

## 2016-11-02 VITALS — BP 155/87 | HR 62 | Temp 98.2°F | Resp 22 | Wt 209.4 lb

## 2016-11-02 DIAGNOSIS — C3411 Malignant neoplasm of upper lobe, right bronchus or lung: Secondary | ICD-10-CM | POA: Diagnosis not present

## 2016-11-02 DIAGNOSIS — R062 Wheezing: Secondary | ICD-10-CM

## 2016-11-02 MED ORDER — PREDNISONE 10 MG PO TABS: ORAL_TABLET | ORAL | 0 refills | Status: DC

## 2016-11-02 NOTE — Progress Notes (Signed)
Walked patient down hall approximately 150 feet with oxygen sensor attached and patient holding on to machine.  Patient walked without getting short of breath.  Oxygen saturation was between 95% and 100% with heart rate remaining between 75-79.  Provider aware

## 2016-11-02 NOTE — Patient Instructions (Signed)
[place] at [facility] Discharge Instructions  RECOMMENDATIONS MADE BY THE CONSULTANT AND ANY TEST RESULTS WILL BE SENT TO YOUR REFERRING PHYSICIAN.  You were seen today by Dr. Whitney Muse Follow up in 4 months  We called you in a prescription for prednisone '20mg'$  once daily for 5 days, 10 mg once daily for 5 days Call us if your breathing does not get any better after prednisone is finished.  Thank you for choosing [place] at [facility] to provide your oncology and hematology care.  To afford each patient quality time with our provider, please arrive at least 15 minutes before your scheduled appointment time.    If you have a lab appointment with the [facility] please come in thru the  Main Entrance and check in at the main information desk  You need to re-schedule your appointment should you arrive 10 or more minutes late.  We strive to give you quality time with our providers, and arriving late affects you and other patients whose appointments are after yours.  Also, if you no show three or more times for appointments you may be dismissed from the clinic at the providers discretion.     Again, thank you for choosing [facility].  Our hope is that these requests will decrease the amount of time that you wait before being seen by our physicians.       _____________________________________________________________  Should you have questions after your visit to [facility], please contact our office at [phone] between the hours of 8:30 a.m. and 4:30 p.m.  Voicemails left after 4:30 p.m. will not be returned until the following business day.  For prescription refill requests, have your pharmacy contact our office.       Resources For Cancer Patients and their Caregivers ? American Cancer Society: Can assist with transportation, wigs, general needs, runs Look Good Feel Better.        325-049-6915 ? Cancer  Care: Provides financial assistance, online support groups, medication/co-pay assistance.  1-800-813-HOPE 548-221-8164) ? [facility] Assists [place] cancer patients and their families through emotional , educational and financial support.  713-568-3904 ? [place] DSS Where to apply for food stamps, Medicaid and utility assistance. 814-487-8419 ? [facility]: Transportation to medical appointments. (763) 343-9008 ? Social Security Administration: May apply for disability if have a Stage IV cancer. 7861292313 (609) 694-4449 ? [employer], Disability and Transit Services: Assists with nutrition, care and transit needs. [place] Support Programs: '@10RELATIVEDAYS'$ @ > Cancer Support Group  2nd Tuesday of the month 1pm-2pm, Journey Room  > Creative Journey  3rd Tuesday of the month 1130am-1pm, Journey Room  > Look Good Feel Better  1st Wednesday of the month 10am-12 noon, Journey Room (Call [place] to register [phone])

## 2016-11-02 NOTE — Progress Notes (Signed)
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  HISTORY OF PRESENT ILLNESS:   Dustin Bradshaw is a 76 y.o. 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  In summary, the patient underwent imaging that demonstrated a right upper lobe mass.  He was referred to pulmonology and underwent a bronchoscopy with biopsies by Dr. Lamonte Sakai on 09/16/2016.  I personally reviewed and went over pathology results with the patient.  Pathology was positive for squamous cell carcinoma.  This lead to further imaging including a PET scan that demonstrated posterior RUL 4.1 cm mass with soft tissue density extending into the apical segmental bronchus concerning for invasion of disease.  He then went on to surgical consultation with Dr. Prescott Gum.  On his review of imaging, he too was concerned about tumor invasion into the bronchus. After receiving surgical clearance, the patient was taken to the OR where Dr. Prescott Gum performed a bronchoscopy prior to planned VATS.  During bronchoscopy, the tumor was identified as invading the bronchus and therefore the VATS procedure was aborted and the patient was referred to medical oncology and radiation oncology for medical management of malignancy.  The patient declined these referral and therefore was referred to Hospice.  He recently discharged himself from [facility] secondary to no performance decline and requests a second opinion regarding his malignancy.  He presents today unaccompanied. He notes that he did radiation and did very well.   Reports he  received a CT of chest Monday 10/30/16 at [facility]  He was seen by radiation oncology and he notes that he was told his scan "looked good,"  States wheezing, started last few weeks and SOB. It was present before his chest CT. He was told to talk to Dr. Whitney Muse about it when he saw his doctor.    Denies cold, cough, sputum production. He still does his ADL's.    Denies general pain.  States he eats well.   Review of Systems  Constitutional:       Eating well.  HENT: Negative.   Eyes: Negative.   Respiratory: Positive for shortness of breath (since past couple weeks) and wheezing (since past couple weeks). Negative for cough and sputum production.        Denies cold   Cardiovascular: Negative.   Genitourinary: Negative.   Musculoskeletal: Negative.  Negative for myalgias.  Skin: Negative.   Neurological: Negative.   Endo/Heme/Allergies: Negative.   Psychiatric/Behavioral: Negative.    14 point review of systems was performed and is negative except as detailed under history of present illness and above  PAST MEDICAL HISTORY:   Past Medical History:  Diagnosis Date  . High cholesterol   . Hypertension   . Pneumonia   . Primary cancer of right upper lobe of lung ([place]) 10/06/2015  . Shortness of breath dyspnea     ALLERGIES: No Known Allergies    MEDICATIONS: I have reviewed the patient's current medications.    Current Outpatient Prescriptions on File Prior to Visit  Medication Sig Dispense Refill  .  amLODipine (NORVASC) 10 MG tablet Take 1 tablet (10 mg total) by mouth daily. 90 tablet 3  . co-enzyme Q-10 30 MG capsule Take 30 mg by mouth 3 (three) times daily.    Mar[place] Kitchen LORazepam (ATIVAN) 0.5 MG tablet Take 1 tablet (0.5 mg total) by mouth as needed. 30 tablet 5  . losartan (COZAAR) 100 MG tablet Take 1 tablet (100 mg total) by mouth daily. 90 tablet 3  . metoprolol (TOPROL-XL) 200 MG 24 hr tablet Take 1 tablet (200 mg total) by mouth daily. 90 tablet 3  . Multiple  Vitamin (MULTIVITAMIN) tablet Take 1 tablet by mouth daily.     No current facility-administered medications on file prior to visit.      PAST SURGICAL HISTORY Past Surgical History:  Procedure Laterality Date  . EYE SURGERY    . VIDEO BRONCHOSCOPY Bilateral 09/17/2015   Procedure: VIDEO BRONCHOSCOPY WITH FLUORO;  Surgeon: Collene Gobble, MD;  Location: [place];  Service: Cardiopulmonary;  Laterality: Bilateral;  . VIDEO BRONCHOSCOPY N/A 11/23/2015   Procedure: VIDEO BRONCHOSCOPY;  Surgeon: Ivin Poot, MD;  Location: [place];  Service: Thoracic;  Laterality: N/A;  . YAG LASER APPLICATION Left 93/26/7124   Procedure: YAG LASER APPLICATION;  Surgeon: Rutherford Guys, MD;  Location: [facility];  Service: Ophthalmology;  Laterality: Left;    FAMILY HISTORY: History reviewed. No pertinent family history.  SOCIAL HISTORY:  reports that he quit smoking about 2 years ago. His smoking use included Cigarettes. He has a 30.00 pack-year smoking history. His smokeless tobacco use includes Chew. He reports that he does not drink alcohol or use drugs.  Social History   Social History  . Marital status: Married    Spouse name: N/A  . Number of children: N/A  . Years of education: N/A   Social History Main Topics  . Smoking status: Former Smoker    Packs/day: 0.50    Years: 60.00    Types: Cigarettes    Quit date: 03/10/2014  . Smokeless tobacco: Current User    Types: Chew  . Alcohol use No  . Drug use: No  . Sexual activity: Not Currently   Other Topics Concern  . None   Social History Narrative  . None    PERFORMANCE STATUS: The patient's performance status is 1 - Symptomatic but completely ambulatory  Most Recent Vital Signs: Blood pressure (!) 155/87, pulse 62, temperature 98.2 F (36.8 C), temperature source Oral, resp. rate (!) 22, weight 209 lb 6.4 oz (95 kg), SpO2 94 %. BP (!) 155/87 (BP Location: Left Arm, Patient Position: Sitting)   Pulse 62   Temp 98.2 F (36.8 C)  (Oral)   Resp (!) 22   Wt 209 lb 6.4 oz (95 kg)   SpO2 94% Comment: room air  BMI 33.80 kg/m   Physical Exam  Constitutional: He is oriented to person, place, and time and well-developed, well-nourished, and in no distress.  HENT:  Head: Normocephalic and atraumatic.  Mouth/Throat: No oropharyngeal exudate.  Eyes: Conjunctivae and EOM are normal. Pupils are equal, round, and reactive to light. No scleral icterus.  Neck: Normal range of motion. Neck supple.  Cardiovascular: Normal rate, regular rhythm and normal heart sounds.   Pulmonary/Chest: Effort normal. No respiratory distress. He has wheezes.  Abdominal: Soft. Bowel sounds are normal. He exhibits no distension and no mass. There is no tenderness. There is no rebound and no guarding.  Obese  Musculoskeletal: Normal range of motion.  Lymphadenopathy:  He has no cervical adenopathy.  Neurological: He is alert and oriented to person, place, and time. Gait normal.  Skin: Skin is warm and dry.  Psychiatric: Mood, memory, affect and judgment normal.  Nursing note and vitals reviewed.      LABORATORY DATA:  I have reviewed the data as listed.  CBC    Component Value Date/Time   WBC 10.0 04/07/2016 1539   RBC 4.75 04/07/2016 1539   HGB 15.0 04/07/2016 1539   HGB 14.2 10/06/2015 1326   HCT 45.6 04/07/2016 1539   HCT 42.1 10/06/2015 1326   PLT 227 04/07/2016 1539   PLT 237 10/06/2015 1326   MCV 96.0 04/07/2016 1539   MCV 95.7 10/06/2015 1326   MCH 31.6 04/07/2016 1539   MCHC 32.9 04/07/2016 1539   RDW 13.8 04/07/2016 1539   RDW 13.7 10/06/2015 1326   LYMPHSABS 3.1 04/07/2016 1539   LYMPHSABS 2.2 10/06/2015 1326   MONOABS 0.6 04/07/2016 1539   MONOABS 1.2 (H) 10/06/2015 1326   EOSABS 0.2 04/07/2016 1539   EOSABS 0.1 10/06/2015 1326   BASOSABS 0.0 04/07/2016 1539   BASOSABS 0.0 10/06/2015 1326      Chemistry      Component Value Date/Time   NA 139 04/07/2016 1539   NA 142 10/06/2015 1327   K 4.0 04/07/2016  1539   K 3.8 10/06/2015 1327   CL 105 04/07/2016 1539   CO2 29 04/07/2016 1539   CO2 26 10/06/2015 1327   BUN 25 (H) 04/07/2016 1539   BUN 21.4 10/06/2015 1327   CREATININE 1.20 06/29/2016 1634   CREATININE 1.1 10/06/2015 1327      Component Value Date/Time   CALCIUM 9.1 04/07/2016 1539   CALCIUM 9.6 10/06/2015 1327   ALKPHOS 82 04/07/2016 1539   ALKPHOS 94 10/06/2015 1327   AST 21 04/07/2016 1539   AST 32 10/06/2015 1327   ALT 27 04/07/2016 1539   ALT 58 (H) 10/06/2015 1327   BILITOT 1.1 04/07/2016 1539   BILITOT 1.12 10/06/2015 1327      RADIOGRAPHY: I have personally reviewed the radiological images as listed and agreed with the findings in the report.  Study Result   CLINICAL DATA:  Status post XRT for lung cancer.  EXAM: CT CHEST WITH CONTRAST  TECHNIQUE: Multidetector CT imaging of the chest was performed during intravenous contrast administration.  CONTRAST:  38m ISOVUE-300 IOPAMIDOL (ISOVUE-300) INJECTION 61%  COMPARISON:  04/19/2016  FINDINGS: Cardiovascular: Mild cardiac enlargement. No pericardial effusion. Aortic atherosclerosis. RCA coronary artery calcifications noted.  Mediastinum/Nodes: The trachea appears patent and is midline. Normal appearance of the esophagus. No mediastinal adenopathy.  Lungs/Pleura: No pleural effusion. The right central perihilar lung mass measures 4.1 x 4.4 cm, image 58 of series 2. This is compared with 5.8 x 5.3 cm previously. Increase in post of obstructive consolidation in atelectasis involving the posterior right upper lobe. Patchy areas pneumonitis and tree-in-bud nodularity are identified within the anterior basilar portion of the right upper lobe. 5 mm right lower lobe lung nodule is identified, image 94 series 4.  Upper Abdomen: Hepatic steatosis noted. Liver cysts identified. Left kidney cysts identified. Left adrenal nodule measures 1.2 cm, image 138 of series 2. Not significantly changed from  previous exam.  Musculoskeletal: No aggressive lytic or sclerotic bone lesions.  IMPRESSION: 1. Interval decrease in size of central right perihilar lung mass. 2. Increase in postobstructive consolidation and atelectasis involving the right upper lobe.   Electronically Signed   By: TKerby Moors  M.D.   On: 06/30/2016 09:22      PATHOLOGY:   Diagnosis Endobronchial biopsy, right upper lobe - NON SMALL CELL CARCINOMA. - SEE COMMENT. Microscopic Comment There is only minimal tumor present, and the features slightly favor squamous cell caricnoma. There is insufficient tissue presents for additional studies. Dr. Lamonte Sakai was paged on 09/20/2015. 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 Scans can be reviewed, I do not have the final report available for review. He does have some wheezing throughout I have written for a short course of prednisone. If he does not improve he is asked to call.  In addition, WE will request his last office visit note from Radiation oncology.   There was insufficient tissue for additional studies with his original pathology. PD L1 testing would be beneficial. Can address in future if needed.  All questions were answered. The patient knows to call the clinic with any problems, questions or concerns. We can certainly see the patient much sooner if necessary.  RTC in 6 months.  Meds ordered this encounter  Medications  . predniSONE (DELTASONE) 10 MG tablet    Sig: Take two (2) tablets daily x 5 days, then take one (1) tablet daily x 5 days    Dispense:  15 tablet    Refill:  0   This document serves as a record of services personally performed by Ancil Linsey, MD. It was created on her behalf by Shirlean Mylar, a  trained medical scribe. The creation of this record is based on the scribe's personal observations and the provider's statements to them. This document has been checked and approved by the attending provider.  I have reviewed the above documentation for accuracy and completeness and I agree with the above.  This note is electronically signed [id number] Cyril Mourning, MD  11/02/2016 10:41 AM

## 2016-11-14 ENCOUNTER — Encounter (HOSPITAL_COMMUNITY): Payer: Self-pay | Admitting: Hematology & Oncology

## 2016-12-11 ENCOUNTER — Ambulatory Visit (INDEPENDENT_AMBULATORY_CARE_PROVIDER_SITE_OTHER): Payer: PPO | Admitting: Physician Assistant

## 2016-12-11 ENCOUNTER — Encounter: Payer: Self-pay | Admitting: Physician Assistant

## 2016-12-11 VITALS — BP 134/83 | HR 66 | Temp 97.7°F | Ht 66.0 in | Wt 212.2 lb

## 2016-12-11 DIAGNOSIS — Z125 Encounter for screening for malignant neoplasm of prostate: Secondary | ICD-10-CM

## 2016-12-11 DIAGNOSIS — Z0000 Encounter for general adult medical examination without abnormal findings: Secondary | ICD-10-CM | POA: Diagnosis not present

## 2016-12-11 DIAGNOSIS — C3411 Malignant neoplasm of upper lobe, right bronchus or lung: Secondary | ICD-10-CM | POA: Diagnosis not present

## 2016-12-11 DIAGNOSIS — I10 Essential (primary) hypertension: Secondary | ICD-10-CM

## 2016-12-11 NOTE — Progress Notes (Signed)
BP 134/83   Pulse 66   Temp 97.7 F (36.5 C) (Oral)   Ht '5\' 6"'$  (1.676 m)   Wt 212 lb 3.2 oz (96.3 kg)   BMI 34.25 kg/m    Subjective:    Patient ID: Dustin Bradshaw, male    DOB: [birth year], 76 y.o.   MRN: [id number]  HPI: Dustin Bradshaw is a 76 y.o. male presenting on 12/11/2016 for Follow-up; Anemia; and Hypertension  This patient comes in for periodic recheck on medications and conditions including hypertension, ling cancer, anxiety.   All medications are reviewed today. There are no reports of any problems with the medications. All of the medical conditions are reviewed and updated.  Lab work is reviewed and will be ordered as medically necessary. There are no new problems reported with today's visit.   Relevant past medical, surgical, family and social history reviewed and updated as indicated. Allergies and medications reviewed and updated.  Past Medical History:  Diagnosis Date  . High cholesterol   . Hypertension   . Pneumonia   . 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 Shortness of breath dyspnea     Past Surgical History:  Procedure Laterality Date  . EYE SURGERY    . VIDEO BRONCHOSCOPY Bilateral 09/17/2015   Procedure: VIDEO BRONCHOSCOPY WITH FLUORO;  Surgeon: Collene Gobble, MD;  Location: [facility];  Service: Cardiopulmonary;  Laterality: Bilateral;  . VIDEO BRONCHOSCOPY N/A 11/23/2015   Procedure: VIDEO BRONCHOSCOPY;  Surgeon: Ivin Poot, MD;  Location: [facility];  Service: Thoracic;  Laterality: N/A;  . YAG LASER APPLICATION Left 89/38/1017   Procedure: YAG LASER APPLICATION;  Surgeon: Rutherford Guys, MD;  Location: [facility];  Service: Ophthalmology;  Laterality: Left;    Review of Systems  Constitutional: Negative.  Negative for appetite change and fatigue.  Eyes: Negative for pain and visual disturbance.  Respiratory: Negative.  Negative for cough, chest tightness, shortness of breath and wheezing.   Cardiovascular: Negative.  Negative  for chest pain, palpitations and leg swelling.  Gastrointestinal: Negative.  Negative for abdominal pain, diarrhea, nausea and vomiting.  Genitourinary: Negative.   Skin: Negative.  Negative for color change and rash.  Neurological: Negative.  Negative for weakness, numbness and headaches.  Psychiatric/Behavioral: Negative.     Allergies as of 12/11/2016   No Known Allergies     Medication List       Accurate as of 12/11/16  8:30 AM. Always use your most recent med list.          amLODipine 10 MG tablet Commonly known as:  NORVASC Take 1 tablet (10 mg total) by mouth daily.   co-enzyme Q-10 30 MG capsule Take 30 mg by mouth 3 (three) times daily.   LORazepam 0.5 MG tablet Commonly known as:  ATIVAN Take 1 tablet (0.5 mg total) by mouth as needed.   losartan 100 MG tablet Commonly known as:  COZAAR Take 1 tablet (100 mg total) by mouth daily.   metoprolol 200 MG 24 hr tablet Commonly known as:  TOPROL-XL Take 1 tablet (200 mg total) by mouth daily.   multivitamin tablet Take 1 tablet by mouth daily.          Objective:    BP 134/83   Pulse 66   Temp 97.7 F (36.5 C) (Oral)   Ht '5\' 6"'$  (1.676 m)   Wt 212 lb 3.2 oz (96.3 kg)   BMI 34.25 kg/m   No Known  Allergies  Physical Exam  Constitutional: He appears well-developed and well-nourished.  HENT:  Head: Normocephalic and atraumatic.  Eyes: Conjunctivae and EOM are normal. Pupils are equal, round, and reactive to light.  Neck: Normal range of motion. Neck supple.  Cardiovascular: Normal rate, regular rhythm and normal heart sounds.   Pulmonary/Chest: Effort normal and breath sounds normal.  Abdominal: Soft. Bowel sounds are normal.  Musculoskeletal: Normal range of motion.  Skin: Skin is warm and dry.  Nursing note and vitals reviewed.      Assessment & Plan:   1. Primary cancer of right upper lobe of lung ([place]  2. Essential hypertension  Outpatient Encounter Prescriptions as of 12/11/2016    Medication Sig  . amLODipine (NORVASC) 10 MG tablet Take 1 tablet (10 mg total) by mouth daily.  Marland Kitchen co-enzyme Q-10 30 MG capsule Take 30 mg by mouth 3 (three) times daily.  Marland Kitchen LORazepam (ATIVAN) 0.5 MG tablet Take 1 tablet (0.5 mg total) by mouth as needed.  Marland Kitchen losartan (COZAAR) 100 MG tablet Take 1 tablet (100 mg total) by mouth daily.  . metoprolol (TOPROL-XL) 200 MG 24 hr tablet Take 1 tablet (200 mg total) by mouth daily.  . Multiple Vitamin (MULTIVITAMIN) tablet Take 1 tablet by mouth daily.  . [DISCONTINUED] predniSONE (DELTASONE) 10 MG tablet Take two (2) tablets daily x 5 days, then take one (1) tablet daily x 5 days   No facility-administered encounter medications on file as of 12/11/2016.     Continue all other maintenance medications as listed above.  Follow up plan: Recheck 3 months  Educational handout given for [place] PA-C [facility] [address]  [place], [place] [address] [phone]   12/11/2016, 8:30 AM

## 2016-12-11 NOTE — Patient Instructions (Signed)
DASH Eating Plan DASH stands for "Dietary Approaches to Stop Hypertension." The DASH eating plan is a healthy eating plan that has been shown to reduce high blood pressure (hypertension). It may also reduce your risk for type 2 diabetes, heart disease, and stroke. The DASH eating plan may also help with weight loss. What are tips for following this plan? General guidelines  Avoid eating more than 2,300 mg (milligrams) of salt (sodium) a day. If you have hypertension, you may need to reduce your sodium intake to 1,500 mg a day.  Limit alcohol intake to no more than 1 drink a day for nonpregnant women and 2 drinks a day for men. One drink equals 12 oz of beer, 5 oz of wine, or 1 oz of hard liquor.  Work with your health care provider to maintain a healthy body weight or to lose weight. Ask what an ideal weight is for you.  Get at least 30 minutes of exercise that causes your heart to beat faster (aerobic exercise) most days of the week. Activities may include walking, swimming, or biking.  Work with your health care provider or diet and nutrition specialist (dietitian) to adjust your eating plan to your individual calorie needs. Reading food labels  Check food labels for the amount of sodium per serving. Choose foods with less than 5 percent of the Daily Value of sodium. Generally, foods with less than 300 mg of sodium per serving fit into this eating plan.  To find whole grains, look for the word "whole" as the first word in the ingredient list. Shopping  Buy products labeled as "low-sodium" or "no salt added."  Buy fresh foods. Avoid canned foods and premade or frozen meals. Cooking  Avoid adding salt when cooking. Use salt-free seasonings or herbs instead of table salt or sea salt. Check with your health care provider or pharmacist before using salt substitutes.  Do not fry foods. Cook foods using healthy methods such as baking, boiling, grilling, and broiling instead.  Cook with  heart-healthy oils, such as olive, canola, soybean, or sunflower oil. Meal planning   Eat a balanced diet that includes: ? 5 or more servings of fruits and vegetables each day. At each meal, try to fill half of your plate with fruits and vegetables. ? Up to 6-8 servings of whole grains each day. ? Less than 6 oz of lean meat, poultry, or fish each day. A 3-oz serving of meat is about the same size as a deck of cards. One egg equals 1 oz. ? 2 servings of low-fat dairy each day. ? A serving of nuts, seeds, or beans 5 times each week. ? Heart-healthy fats. Healthy fats called Omega-3 fatty acids are found in foods such as flaxseeds and coldwater fish, like sardines, salmon, and mackerel.  Limit how much you eat of the following: ? Canned or prepackaged foods. ? Food that is high in trans fat, such as fried foods. ? Food that is high in saturated fat, such as fatty meat. ? Sweets, desserts, sugary drinks, and other foods with added sugar. ? Full-fat dairy products.  Do not salt foods before eating.  Try to eat at least 2 vegetarian meals each week.  Eat more home-cooked food and less restaurant, buffet, and fast food.  When eating at a restaurant, ask that your food be prepared with less salt or no salt, if possible. What foods are recommended? The items listed may not be a complete list. Talk with your dietitian about what   dietary choices are best for you. Grains Whole-grain or whole-wheat bread. Whole-grain or whole-wheat pasta. Brown rice. Oatmeal. Quinoa. Bulgur. Whole-grain and low-sodium cereals. Pita bread. Low-fat, low-sodium crackers. Whole-wheat flour tortillas. Vegetables Fresh or frozen vegetables (raw, steamed, roasted, or grilled). Low-sodium or reduced-sodium tomato and vegetable juice. Low-sodium or reduced-sodium tomato sauce and tomato paste. Low-sodium or reduced-sodium canned vegetables. Fruits All fresh, dried, or frozen fruit. Canned fruit in natural juice (without  added sugar). Meat and other protein foods Skinless chicken or turkey. Ground chicken or turkey. Pork with fat trimmed off. Fish and seafood. Egg whites. Dried beans, peas, or lentils. Unsalted nuts, nut butters, and seeds. Unsalted canned beans. Lean cuts of beef with fat trimmed off. Low-sodium, lean deli meat. Dairy Low-fat (1%) or fat-free (skim) milk. Fat-free, low-fat, or reduced-fat cheeses. Nonfat, low-sodium ricotta or cottage cheese. Low-fat or nonfat yogurt. Low-fat, low-sodium cheese. Fats and oils Soft margarine without trans fats. Vegetable oil. Low-fat, reduced-fat, or light mayonnaise and salad dressings (reduced-sodium). Canola, safflower, olive, soybean, and sunflower oils. Avocado. Seasoning and other foods Herbs. Spices. Seasoning mixes without salt. Unsalted popcorn and pretzels. Fat-free sweets. What foods are not recommended? The items listed may not be a complete list. Talk with your dietitian about what dietary choices are best for you. Grains Baked goods made with fat, such as croissants, muffins, or some breads. Dry pasta or rice meal packs. Vegetables Creamed or fried vegetables. Vegetables in a cheese sauce. Regular canned vegetables (not low-sodium or reduced-sodium). Regular canned tomato sauce and paste (not low-sodium or reduced-sodium). Regular tomato and vegetable juice (not low-sodium or reduced-sodium). Pickles. Olives. Fruits Canned fruit in a light or heavy syrup. Fried fruit. Fruit in cream or butter sauce. Meat and other protein foods Fatty cuts of meat. Ribs. Fried meat. Bacon. Sausage. Bologna and other processed lunch meats. Salami. Fatback. Hotdogs. Bratwurst. Salted nuts and seeds. Canned beans with added salt. Canned or smoked fish. Whole eggs or egg yolks. Chicken or turkey with skin. Dairy Whole or 2% milk, cream, and half-and-half. Whole or full-fat cream cheese. Whole-fat or sweetened yogurt. Full-fat cheese. Nondairy creamers. Whipped toppings.  Processed cheese and cheese spreads. Fats and oils Butter. Stick margarine. Lard. Shortening. Ghee. Bacon fat. Tropical oils, such as coconut, palm kernel, or palm oil. Seasoning and other foods Salted popcorn and pretzels. Onion salt, garlic salt, seasoned salt, table salt, and sea salt. Worcestershire sauce. Tartar sauce. Barbecue sauce. Teriyaki sauce. Soy sauce, including reduced-sodium. Steak sauce. Canned and packaged gravies. Fish sauce. Oyster sauce. Cocktail sauce. Horseradish that you find on the shelf. Ketchup. Mustard. Meat flavorings and tenderizers. Bouillon cubes. Hot sauce and Tabasco sauce. Premade or packaged marinades. Premade or packaged taco seasonings. Relishes. Regular salad dressings. Where to find more information:  National Heart, Lung, and Blood Institute: www.nhlbi.nih.gov  American Heart Association: www.heart.org Summary  The DASH eating plan is a healthy eating plan that has been shown to reduce high blood pressure (hypertension). It may also reduce your risk for type 2 diabetes, heart disease, and stroke.  With the DASH eating plan, you should limit salt (sodium) intake to 2,300 mg a day. If you have hypertension, you may need to reduce your sodium intake to 1,500 mg a day.  When on the DASH eating plan, aim to eat more fresh fruits and vegetables, whole grains, lean proteins, low-fat dairy, and heart-healthy fats.  Work with your health care provider or diet and nutrition specialist (dietitian) to adjust your eating plan to your individual   calorie needs. This information is not intended to replace advice given to you by your health care provider. Make sure you discuss any questions you have with your health care provider. Document Released: 09/14/2011 Document Revised: 09/18/2016 Document Reviewed: 09/18/2016 Elsevier Interactive Patient Education  2017 [employer].  

## 2016-12-12 LAB — LIPID PANEL
CHOLESTEROL TOTAL: 228 mg/dL — AB (ref 100–199)
Chol/HDL Ratio: 6.2 ratio units — ABNORMAL HIGH (ref 0.0–5.0)
HDL: 37 mg/dL — ABNORMAL LOW (ref >39)
LDL CALC: 140 mg/dL — AB (ref 0–99)
Triglycerides: 255 mg/dL — ABNORMAL HIGH (ref 0–149)
VLDL CHOLESTEROL CAL: 51 mg/dL — AB (ref 5–40)

## 2016-12-12 LAB — CMP14+EGFR
ALBUMIN: 4 g/dL (ref 3.5–4.8)
ALK PHOS: 113 IU/L (ref 39–117)
ALT: 18 IU/L (ref 0–44)
AST: 17 IU/L (ref 0–40)
Albumin/Globulin Ratio: 1.4 (ref 1.2–2.2)
BILIRUBIN TOTAL: 0.8 mg/dL (ref 0.0–1.2)
BUN / CREAT RATIO: 15 (ref 10–24)
BUN: 16 mg/dL (ref 8–27)
CHLORIDE: 99 mmol/L (ref 96–106)
CO2: 27 mmol/L (ref 18–29)
Calcium: 9.4 mg/dL (ref 8.6–10.2)
Creatinine, Ser: 1.08 mg/dL (ref 0.76–1.27)
GFR calc non Af Amer: 66 mL/min/{1.73_m2} (ref >59)
GFR, EST AFRICAN AMERICAN: 77 mL/min/{1.73_m2} (ref >59)
GLOBULIN, TOTAL: 2.9 g/dL (ref 1.5–4.5)
Glucose: 109 mg/dL — ABNORMAL HIGH (ref 65–99)
Potassium: 3.9 mmol/L (ref 3.5–5.2)
SODIUM: 142 mmol/L (ref 134–144)
TOTAL PROTEIN: 6.9 g/dL (ref 6.0–8.5)

## 2016-12-12 LAB — CBC WITH DIFFERENTIAL/PLATELET
BASOS ABS: 0 10*3/uL (ref 0.0–0.2)
BASOS: 0 %
EOS (ABSOLUTE): 0.1 10*3/uL (ref 0.0–0.4)
EOS: 2 %
HEMATOCRIT: 45.5 % (ref 37.5–51.0)
HEMOGLOBIN: 15.3 g/dL (ref 13.0–17.7)
IMMATURE GRANS (ABS): 0 10*3/uL (ref 0.0–0.1)
Immature Granulocytes: 1 %
LYMPHS ABS: 1.2 10*3/uL (ref 0.7–3.1)
Lymphs: 18 %
MCH: 31.2 pg (ref 26.6–33.0)
MCHC: 33.6 g/dL (ref 31.5–35.7)
MCV: 93 fL (ref 79–97)
MONOCYTES: 8 %
Monocytes Absolute: 0.5 10*3/uL (ref 0.1–0.9)
NEUTROS ABS: 4.7 10*3/uL (ref 1.4–7.0)
Neutrophils: 71 %
Platelets: 264 10*3/uL (ref 150–379)
RBC: 4.91 x10E6/uL (ref 4.14–5.80)
RDW: 15.8 % — ABNORMAL HIGH (ref 12.3–15.4)
WBC: 6.6 10*3/uL (ref 3.4–10.8)

## 2016-12-12 LAB — PSA: PROSTATE SPECIFIC AG, SERUM: 2.3 ng/mL (ref 0.0–4.0)

## 2016-12-12 LAB — TSH: TSH: 4.26 u[IU]/mL (ref 0.450–4.500)

## 2017-01-26 DIAGNOSIS — C3490 Malignant neoplasm of unspecified part of unspecified bronchus or lung: Secondary | ICD-10-CM | POA: Diagnosis not present

## 2017-01-26 DIAGNOSIS — I700 Atherosclerosis of aorta: Secondary | ICD-10-CM | POA: Diagnosis not present

## 2017-01-26 DIAGNOSIS — I2510 Atherosclerotic heart disease of native coronary artery without angina pectoris: Secondary | ICD-10-CM | POA: Diagnosis not present

## 2017-01-26 DIAGNOSIS — R918 Other nonspecific abnormal finding of lung field: Secondary | ICD-10-CM | POA: Diagnosis not present

## 2017-01-26 DIAGNOSIS — Z923 Personal history of irradiation: Secondary | ICD-10-CM | POA: Diagnosis not present

## 2017-01-30 ENCOUNTER — Other Ambulatory Visit (HOSPITAL_COMMUNITY): Payer: Self-pay | Admitting: Radiation Oncology

## 2017-01-30 DIAGNOSIS — C3411 Malignant neoplasm of upper lobe, right bronchus or lung: Secondary | ICD-10-CM | POA: Diagnosis not present

## 2017-01-30 DIAGNOSIS — Z923 Personal history of irradiation: Secondary | ICD-10-CM | POA: Diagnosis not present

## 2017-01-30 DIAGNOSIS — C3490 Malignant neoplasm of unspecified part of unspecified bronchus or lung: Secondary | ICD-10-CM

## 2017-02-08 ENCOUNTER — Encounter (HOSPITAL_COMMUNITY): Payer: PPO

## 2017-02-14 ENCOUNTER — Encounter (HOSPITAL_COMMUNITY)
Admission: RE | Admit: 2017-02-14 | Discharge: 2017-02-14 | Disposition: A | Discharge status: Home / Self Care | Payer: PPO | Source: Ambulatory Visit | Attending: Radiation Oncology | Admitting: Radiation Oncology

## 2017-02-14 DIAGNOSIS — C3490 Malignant neoplasm of unspecified part of unspecified bronchus or lung: Secondary | ICD-10-CM | POA: Insufficient documentation

## 2017-02-14 LAB — GLUCOSE, CAPILLARY: Glucose-Capillary: 101 mg/dL — ABNORMAL HIGH (ref 65–99)

## 2017-02-14 MED ORDER — FLUDEOXYGLUCOSE F - 18 (FDG) INJECTION
10.5000 | Freq: Once | INTRAVENOUS | Status: AC | PRN
  Administered 2017-02-14: 10.5 via INTRAVENOUS

## 2017-02-20 ENCOUNTER — Ambulatory Visit (INDEPENDENT_AMBULATORY_CARE_PROVIDER_SITE_OTHER): Payer: PPO | Admitting: *Deleted

## 2017-02-20 VITALS — BP 125/82 | HR 67 | Temp 97.5°F | Ht 65.6 in | Wt 213.8 lb

## 2017-02-20 DIAGNOSIS — Z0000 Encounter for general adult medical examination without abnormal findings: Secondary | ICD-10-CM

## 2017-02-20 NOTE — Patient Instructions (Addendum)
Keep Follow up Appointment with [facility] to check on price of tetanus and pneumonia vaccine Walk 30 minutes 3 times weekly Try Seated strengthening training Try to cut out chewing tobacco.  Dustin Bradshaw , Thank you for taking time to come for your Medicare Wellness Visit. I appreciate your ongoing commitment to your health goals. Please review the following plan we discussed and let me know if I can assist you in the future.   These are the goals we discussed: Goals    . Exercise 3x per week (30 min per time)          Walk 30 minutes 3 times weekly    . Have 3 meals a day          Increase meals to 3 times daily Increase fruits and vegetables Increase protein        This is a list of the screening recommended for you and due dates:  Health Maintenance  Topic Date Due  . Pneumonia vaccines (1 of 2 - PCV13) 03/14/2017*  . Tetanus Vaccine  10/05/2017*  . Flu Shot  05/09/2017  *Topic was postponed. The date shown is not the original due date.   Advance Directive Advance directives are legal documents that let you make choices ahead of time about your health care and medical treatment in case you become unable to communicate for yourself. Advance directives are a way for you to communicate your wishes to family, friends, and health care providers. This can help convey your decisions about end-of-life care if you become unable to communicate. Discussing and writing advance directives should happen over time rather than all at once. Advance directives can be changed depending on your situation and what you want, even after you have signed the advance directives. If you do not have an advance directive, some states assign family decision makers to act on your behalf based on how closely you are related to them. Each state has its own laws regarding advance directives. You may want to check with your health care provider, attorney, or state representative about the  laws in your state. There are different types of advance directives, such as:  Medical power of attorney.  Living will.  Do not resuscitate (DNR) or do not attempt resuscitation (DNAR) order. Health care proxy and medical power of attorney A health care proxy, also called a health care agent, is a person who is appointed to make medical decisions for you in cases in which you are unable to make the decisions yourself. Generally, people choose someone they know well and trust to represent their preferences. Make sure to ask this person for an agreement to act as your proxy. A proxy may have to exercise judgment in the event of a medical decision for which your wishes are not known. A medical power of attorney is a legal document that names your health care proxy. Depending on the laws in your state, after the document is written, it may also need to be:  Signed.  Notarized.  Dated.  Copied.  Witnessed.  Incorporated into your medical record. You may also want to appoint someone to manage your financial affairs in a situation in which you are unable to do so. This is called a durable power of attorney for finances. It is a separate legal document from the durable power of attorney for health care. You may choose the same person or someone different from your health care proxy to act as your  agent in financial matters. If you do not appoint a proxy, or if there is a concern that the proxy is not acting in your best interests, a court-appointed guardian may be designated to act on your behalf. Living will A living will is a set of instructions documenting your wishes about medical care when you cannot express them yourself. Health care providers should keep a copy of your living will in your medical record. You may want to give a copy to family members or friends. To alert caregivers in case of an emergency, you can place a card in your wallet to let them know that you have a living will and  where they can find it. A living will is used if you become:  Terminally ill.  Incapacitated.  Unable to communicate or make decisions. Items to consider in your living will include:  The use or non-use of life-sustaining equipment, such as dialysis machines and breathing machines (ventilators).  A DNR or DNAR order, which is the instruction not to use cardiopulmonary resuscitation (CPR) if breathing or heartbeat stops.  The use or non-use of tube feeding.  Withholding of food and fluids.  Comfort (palliative) care when the goal becomes comfort rather than a cure.  Organ and tissue donation. A living will does not give instructions for distributing your money and property if you should pass away. It is recommended that you seek the advice of a lawyer when writing a will. Decisions about taxes, beneficiaries, and asset distribution will be legally binding. This process can relieve your family and friends of any concerns surrounding disputes or questions that may come up about the distribution of your assets. DNR or DNAR A DNR or DNAR order is a request not to have CPR in the event that your heart stops beating or you stop breathing. If a DNR or DNAR order has not been made and shared, a health care provider will try to help any patient whose heart has stopped or who has stopped breathing. If you plan to have surgery, talk with your health care provider about how your DNR or DNAR order will be followed if problems occur. Summary  Advance directives are the legal documents that allow you to make choices ahead of time about your health care and medical treatment in case you become unable to communicate for yourself.  The process of discussing and writing advance directives should happen over time. You can change the advance directives, even after you have signed them.  Advance directives include DNR or DNAR orders, living wills, and designating an agent as your medical power of  attorney. This information is not intended to replace advice given to you by your health care provider. Make sure you discuss any questions you have with your health care provider. Document Released: 01/02/2008 Document Revised: 08/14/2016 Document Reviewed: 08/14/2016 Elsevier Interactive Patient Education  2017 [employer].

## 2017-02-20 NOTE — Progress Notes (Signed)
Subjective:   Dustin Bradshaw is a 76 y.o. male who presents for an Initial Medicare Annual Wellness Visit. Dustin Bradshaw is retired from an [employer] who lives at home with his wife.  Dustin Bradshaw has a daughter and son in law that is still living. Patients oldest daughter passed away a year ago due to cancer.  He enjoys working in his yard and attending church three times a week. Dustin Bradshaw feels the same as it was a year ago.      Objective:    Today's Vitals   02/20/17 1557  BP: 125/82  Pulse: 67  Temp: 97.5 F (36.4 C)  TempSrc: Oral  Weight: 213 lb 12.8 oz (97 kg)  Height: 5' 5.6" (1.666 m)   Body mass index is 34.93 kg/m.  Current Medications (verified) Outpatient Encounter Prescriptions as of 02/20/2017  Medication Sig  . amLODipine (NORVASC) 10 MG tablet Take 1 tablet (10 mg total) by mouth daily.  Marland Kitchen aspirin EC 81 MG tablet Take 81 mg by mouth daily.  Marland Kitchen co-enzyme Q-10 30 MG capsule Take 30 mg by mouth 3 (three) times daily.  Marland Kitchen LORazepam (ATIVAN) 0.5 MG tablet Take 1 tablet (0.5 mg total) by mouth as needed.  Marland Kitchen losartan (COZAAR) 100 MG tablet Take 1 tablet (100 mg total) by mouth daily.  . metoprolol (TOPROL-XL) 200 MG 24 hr tablet Take 1 tablet (200 mg total) by mouth daily.  . Multiple Vitamin (MULTIVITAMIN) tablet Take 1 tablet by mouth daily.   No facility-administered encounter medications on file as of 02/20/2017.     Allergies (verified) Patient has no known allergies.   History: Past Medical History:  Diagnosis Date  . High cholesterol   . Hypertension   . Pneumonia   . Primary cancer of right upper lobe of lung ([place]) 10/06/2015  . Shortness of breath dyspnea    Past Surgical History:  Procedure Laterality Date  . EYE SURGERY    . VIDEO BRONCHOSCOPY Bilateral 09/17/2015   Procedure: VIDEO BRONCHOSCOPY WITH FLUORO;  Surgeon: Collene Gobble, MD;  Location: [facility];  Service: Cardiopulmonary;  Laterality: Bilateral;  . VIDEO BRONCHOSCOPY N/A  11/23/2015   Procedure: VIDEO BRONCHOSCOPY;  Surgeon: Ivin Poot, MD;  Location: [facility];  Service: Thoracic;  Laterality: N/A;  . YAG LASER APPLICATION Left 72/62/0355   Procedure: YAG LASER APPLICATION;  Surgeon: Rutherford Guys, MD;  Location: [facility];  Service: Ophthalmology;  Laterality: Left;   Family History  Problem Relation Age of Onset  . Cancer Father   . Cancer Sister   . Cancer Maternal Uncle   . Cancer Daughter    Social History   Occupational History  . Not on file.   Social History Main Topics  . Smoking status: Former Smoker    Packs/day: 0.50    Years: 60.00    Types: Cigarettes    Quit date: 03/10/2014  . Smokeless tobacco: Current User    Types: Chew  . Alcohol use No  . Drug use: No  . Sexual activity: Not Currently   Tobacco Counseling Currently uses chewing tobacco occasionally.  Not ready to quit.   Activities of Daily Living In your present state of health, do you have any difficulty performing the following activities: 02/20/2017  Hearing? N  Vision? N  Difficulty concentrating or making decisions? N  Walking or climbing stairs? Y  Dressing or bathing? N  Doing errands, shopping? N  Some recent data might be hidden  Patient has dyspnea due to lung cancer while climbing stairs  Immunizations and Health Maintenance Immunization History  Administered Date(s) Administered  . Influenza,inj,Quad PF,36+ Mos 07/12/2015  . Influenza-Unspecified 07/12/2016   There are no preventive care reminders to display for this patient.  Patient Care Team: Theodoro Clock as PCP - General (Physician Assistant) Patrici Ranks, MD (Hematology and Oncology)  Indicate any recent Medical Services you may have received from other than [facility] providers in the past year (date may be approximate).    Assessment:   This is a routine wellness examination for Dustin Bradshaw.  Hearing/Vision screen Patient has had a recent eye exam done at [facility] in [place].   Copy of records requested.   Dietary issues and exercise activities discussed: Current Exercise Habits: The patient does not participate in regular exercise at present, Exercise limited by: respiratory conditions(s)  Goals    . Exercise 3x per week (30 min per time)          Walk 30 minutes 3 times weekly    . Have 3 meals a day          Increase meals to 3 times daily Increase fruits and vegetables Increase protein       Depression Screen PHQ 2/9 Scores 02/20/2017 12/11/2016  PHQ - 2 Score 0 0    Fall Risk Fall Risk  02/20/2017 12/11/2016  Falls in the past year? No No    Cognitive Function: MMSE - Mini Mental State Exam 02/20/2017  Orientation to time 5  Orientation to Place 5  Registration 3  Attention/ Calculation 2  Recall 0  Language- name 2 objects 2  Language- repeat 1  Language- follow 3 step command 3  Language- read & follow direction 1  Write a sentence 1  Copy design 1  Total score 24  Patient has noticed a decline in short term memory.       Screening Tests Health Maintenance  Topic Date Due  . PNA vac Low Risk Adult (1 of 2 - PCV13) 03/14/2017 (Originally 03/18/2005)  . TETANUS/TDAP  10/05/2017 (Originally 03/19/1960)  . INFLUENZA VACCINE  05/09/2017  Patient will  Call insurance to check on prices of pneumonia and tetanus vaccines      Plan:   Encourage patient to eat 3 meals daily that consist of fruits, vegetables, and lean protein.  Encourage patient to walk 30 minutes 3 times weekly. Seated Strengthening Exercise Handouts given to patient Will call and check prices on tetanus and pneumonia vaccine before follow up appt with Particia Nearing, PA in June. Instructed patient to work on quitting chewing tobacco.  Patient declines colonoscopy at this time.    I have personally reviewed and noted the following in the patient's chart:   . Medical and social history . Use of alcohol, tobacco or illicit drugs  . Current medications and  supplements . Functional ability and status . Nutritional status . Physical activity . Advanced directives . List of other physicians . Hospitalizations, surgeries, and ER visits in previous 12 months . Vitals . Screenings to include cognitive, depression, and falls . Referrals and appointments  In addition, I have reviewed and discussed with patient certain preventive protocols, quality metrics, and best practice recommendations. A written personalized care plan for preventive services as well as general preventive health recommendations were provided to patient.     Wardell Heath, LPN   05/26/5630    I have reviewed and agree with the above AWV  documentation.   Terald Sleeper PA-C [facility] [address]  [place], [place] [address] [phone]

## 2017-02-21 DIAGNOSIS — I6782 Cerebral ischemia: Secondary | ICD-10-CM | POA: Diagnosis not present

## 2017-02-21 DIAGNOSIS — G319 Degenerative disease of nervous system, unspecified: Secondary | ICD-10-CM | POA: Diagnosis not present

## 2017-02-21 DIAGNOSIS — C3490 Malignant neoplasm of unspecified part of unspecified bronchus or lung: Secondary | ICD-10-CM | POA: Diagnosis not present

## 2017-02-26 DIAGNOSIS — J449 Chronic obstructive pulmonary disease, unspecified: Secondary | ICD-10-CM | POA: Diagnosis not present

## 2017-02-26 DIAGNOSIS — C3411 Malignant neoplasm of upper lobe, right bronchus or lung: Secondary | ICD-10-CM | POA: Diagnosis not present

## 2017-02-26 DIAGNOSIS — E785 Hyperlipidemia, unspecified: Secondary | ICD-10-CM | POA: Diagnosis not present

## 2017-02-26 DIAGNOSIS — N400 Enlarged prostate without lower urinary tract symptoms: Secondary | ICD-10-CM | POA: Diagnosis not present

## 2017-02-26 DIAGNOSIS — R609 Edema, unspecified: Secondary | ICD-10-CM | POA: Diagnosis not present

## 2017-03-02 ENCOUNTER — Ambulatory Visit (HOSPITAL_COMMUNITY): Payer: PPO

## 2017-03-14 ENCOUNTER — Ambulatory Visit (INDEPENDENT_AMBULATORY_CARE_PROVIDER_SITE_OTHER): Payer: PPO | Admitting: Physician Assistant

## 2017-03-14 ENCOUNTER — Encounter: Payer: Self-pay | Admitting: Physician Assistant

## 2017-03-14 VITALS — BP 125/78 | HR 56 | Temp 99.0°F | Ht 65.6 in | Wt 211.8 lb

## 2017-03-14 DIAGNOSIS — C3411 Malignant neoplasm of upper lobe, right bronchus or lung: Secondary | ICD-10-CM | POA: Diagnosis not present

## 2017-03-14 DIAGNOSIS — J438 Other emphysema: Secondary | ICD-10-CM | POA: Diagnosis not present

## 2017-03-14 DIAGNOSIS — F419 Anxiety disorder, unspecified: Secondary | ICD-10-CM

## 2017-03-14 DIAGNOSIS — E7800 Pure hypercholesterolemia, unspecified: Secondary | ICD-10-CM | POA: Diagnosis not present

## 2017-03-14 DIAGNOSIS — I10 Essential (primary) hypertension: Secondary | ICD-10-CM

## 2017-03-14 MED ORDER — PREDNISONE 10 MG PO TABS: 10.0000 mg | ORAL_TABLET | Freq: Every day | ORAL | 2 refills | Status: DC

## 2017-03-14 MED ORDER — LORAZEPAM 0.5 MG PO TABS: 0.5000 mg | ORAL_TABLET | ORAL | 5 refills | Status: DC | PRN

## 2017-03-14 NOTE — Progress Notes (Signed)
BP 125/78   Pulse (!) 56   Temp 99 F (37.2 C) (Oral)   Ht 5' 5.6" (1.666 m)   Wt 211 lb 12.8 oz (96.1 kg)   BMI 34.60 kg/m    Subjective:    Patient ID: Dustin Bradshaw, male    DOB: 1941/06/25, 76 y.o.   MRN: [id number]  HPI: [name] is a 76 y.o. male presenting on 03/14/2017 for Follow-up (3 month) and Hypertension  This patient comes in for periodic recheck on medications and conditions including emphysema, lung cancer, hypertension, anxiety and was recently elevated cholesterol at last lab. He is to recheck on lab today. He reports overall doing very well. He is having very good days most of the time. Yesterday had a lot of yard work that he accomplish including cleaning out the garage with his wife. He had recently gone to his oncologist and they are still pursuing a conservative treatment on his lung cancer and he agrees with this method. He does need a refill on his prednisone that he took at times when he was having a flareup of his emphysema symptoms. The patient really appears to be quite satisfied and happy with things that are going on in the state of his ability to still be quite physical.   All medications are reviewed today. There are no reports of any problems with the medications. All of the medical conditions are reviewed and updated.  Lab work is reviewed and will be ordered as medically necessary. There are no new problems reported with today's visit.   Relevant past medical, surgical, family and social history reviewed and updated as indicated. Allergies and medications reviewed and updated.  Past Medical History:  Diagnosis Date  . High cholesterol   . Hypertension   . Pneumonia   . Primary cancer of right upper lobe of lung ([place]) 10/06/2015  . Shortness of breath dyspnea     Past Surgical History:  Procedure Laterality Date  . EYE SURGERY    . VIDEO BRONCHOSCOPY Bilateral 09/17/2015   Procedure: VIDEO BRONCHOSCOPY WITH FLUORO;  Surgeon: Collene Gobble,  MD;  Location: [facility];  Service: Cardiopulmonary;  Laterality: Bilateral;  . VIDEO BRONCHOSCOPY N/A 11/23/2015   Procedure: VIDEO BRONCHOSCOPY;  Surgeon: Ivin Poot, MD;  Location: [facility];  Service: Thoracic;  Laterality: N/A;  . YAG LASER APPLICATION Left 86/57/8469   Procedure: YAG LASER APPLICATION;  Surgeon: Rutherford Guys, MD;  [facility];  Service: Ophthalmology;  Laterality: Left;    Review of Systems  Constitutional: Negative.  Negative for appetite change and fatigue.  HENT: Negative.   Eyes: Negative.  Negative for pain and visual disturbance.  Respiratory: Positive for cough and chest tightness. Negative for shortness of breath and wheezing.   Cardiovascular: Negative.  Negative for chest pain, palpitations and leg swelling.  Gastrointestinal: Negative.  Negative for abdominal pain, diarrhea, nausea and vomiting.  Endocrine: Negative.   Genitourinary: Negative.   Musculoskeletal: Negative.   Skin: Negative.  Negative for color change and rash.  Neurological: Negative.  Negative for weakness, numbness and headaches.  Psychiatric/Behavioral: Negative.     Allergies as of 03/14/2017   No Known Allergies     Medication List       Accurate as of 03/14/17  8:50 AM. Always use your most recent med list.          amLODipine 10 MG tablet Commonly known as:  NORVASC Take 1 tablet (10 mg total) by mouth  daily.   aspirin EC 81 MG tablet Take 81 mg by mouth daily.   co-enzyme Q-10 30 MG capsule Take 30 mg by mouth 3 (three) times daily.   LORazepam 0.5 MG tablet Commonly known as:  ATIVAN Take 1 tablet (0.5 mg total) by mouth as needed.   losartan 100 MG tablet Commonly known as:  COZAAR Take 1 tablet (100 mg total) by mouth daily.   metoprolol 200 MG 24 hr tablet Commonly known as:  TOPROL-XL Take 1 tablet (200 mg total) by mouth daily.   multivitamin tablet Take 1 tablet by mouth daily.   predniSONE 10 MG tablet Commonly known as:  DELTASONE Take  1 tablet (10 mg total) by mouth daily with breakfast.          Objective:    BP 125/78   Pulse (!) 56   Temp 99 F (37.2 C) (Oral)   Ht 5' 5.6" (1.666 m)   Wt 211 lb 12.8 oz (96.1 kg)   BMI 34.60 kg/m   No Known Allergies  Physical Exam  Constitutional: He appears well-developed and well-nourished.  HENT:  Head: Normocephalic and atraumatic.  Eyes: Conjunctivae and EOM are normal. Pupils are equal, round, and reactive to light.  Neck: Normal range of motion. Neck supple.  Cardiovascular: Normal rate, regular rhythm and normal heart sounds.   Pulmonary/Chest: Effort normal and breath sounds normal.  Abdominal: Soft. Bowel sounds are normal.  Musculoskeletal: Normal range of motion.  Skin: Skin is warm and dry.  Nursing note and vitals reviewed.   Results for orders placed or performed during the hospital encounter of 02/14/17  Glucose, capillary  Result Value Ref Range   Glucose-Capillary 101 (H) 65 - 99 mg/dL      Assessment & Plan:   1. Other emphysema ([place])  2. Primary cancer of right upper lobe of lung ([facility]) - predniSONE (DELTASONE) 10 MG tablet; Take 1 tablet (10 mg total) by mouth daily with breakfast.  Dispense: 30 tablet; Refill: 2 Following with [facility] oncology center in [facility] Recent note reviewed  3. Essential hypertension  4. Anxiety - LORazepam (ATIVAN) 0.5 MG tablet; Take 1 tablet (0.5 mg total) by mouth as needed.  Dispense: 30 tablet; Refill: 5  5. Elevated cholesterol - Lipid panel   Current Outpatient Prescriptions:  .  amLODipine (NORVASC) 10 MG tablet, Take 1 tablet (10 mg total) by mouth daily., Disp: 90 tablet, Rfl: 3 .  aspirin EC 81 MG tablet, Take 81 mg by mouth daily., Disp: , Rfl:  .  co-enzyme Q-10 30 MG capsule, Take 30 mg by mouth 3 (three) times daily., Disp: , Rfl:  .  LORazepam (ATIVAN) 0.5 MG tablet, Take 1 tablet (0.5 mg total) by mouth as needed., Disp: 30 tablet, Rfl: 5 .  losartan (COZAAR) 100 MG  tablet, Take 1 tablet (100 mg total) by mouth daily., Disp: 90 tablet, Rfl: 3 .  metoprolol (TOPROL-XL) 200 MG 24 hr tablet, Take 1 tablet (200 mg total) by mouth daily., Disp: 90 tablet, Rfl: 3 .  Multiple Vitamin (MULTIVITAMIN) tablet, Take 1 tablet by mouth daily., Disp: , Rfl:  .  predniSONE (DELTASONE) 10 MG tablet, Take 1 tablet (10 mg total) by mouth daily with breakfast., Disp: 30 tablet, Rfl: 2  Continue all other maintenance medications as listed above.  Follow up plan: Return in about 3 months (around 06/14/2017) for recheck.  Educational handout given for [facility] PA-C [facility]  [address]  [place], [place] [address] [phone]   03/14/2017, 8:50 AM

## 2017-03-14 NOTE — Patient Instructions (Signed)
In a few days you may receive a survey in the mail or online from [employer] regarding your visit with us today. Please take a moment to fill this out. Your feedback is very important to our whole office. It can help us better understand your needs as well as improve your experience and satisfaction. Thank you for taking your time to complete it. We care about you.  Myrle Dues, PA-C  

## 2017-03-15 LAB — LIPID PANEL
CHOLESTEROL TOTAL: 158 mg/dL (ref 100–199)
Chol/HDL Ratio: 3.9 ratio (ref 0.0–5.0)
HDL: 41 mg/dL (ref >39)
LDL Calculated: 85 mg/dL (ref 0–99)
TRIGLYCERIDES: 161 mg/dL — AB (ref 0–149)
VLDL CHOLESTEROL CAL: 32 mg/dL (ref 5–40)

## 2017-05-09 DIAGNOSIS — C3411 Malignant neoplasm of upper lobe, right bronchus or lung: Secondary | ICD-10-CM | POA: Diagnosis not present

## 2017-06-20 ENCOUNTER — Encounter: Payer: Self-pay | Admitting: Physician Assistant

## 2017-06-20 ENCOUNTER — Ambulatory Visit (INDEPENDENT_AMBULATORY_CARE_PROVIDER_SITE_OTHER): Payer: PPO | Admitting: Physician Assistant

## 2017-06-20 VITALS — BP 136/86 | HR 65 | Temp 98.8°F | Ht 66.0 in | Wt 212.0 lb

## 2017-06-20 DIAGNOSIS — C3411 Malignant neoplasm of upper lobe, right bronchus or lung: Secondary | ICD-10-CM | POA: Diagnosis not present

## 2017-06-20 DIAGNOSIS — R351 Nocturia: Secondary | ICD-10-CM

## 2017-06-20 DIAGNOSIS — N401 Enlarged prostate with lower urinary tract symptoms: Secondary | ICD-10-CM | POA: Diagnosis not present

## 2017-06-20 DIAGNOSIS — R350 Frequency of micturition: Secondary | ICD-10-CM

## 2017-06-20 LAB — URINALYSIS, COMPLETE
Bilirubin, UA: NEGATIVE
Glucose, UA: NEGATIVE
Ketones, UA: NEGATIVE
LEUKOCYTES UA: NEGATIVE
Nitrite, UA: NEGATIVE
PH UA: 7 (ref 5.0–7.5)
Specific Gravity, UA: 1.01 (ref 1.005–1.030)
Urobilinogen, Ur: 0.2 mg/dL (ref 0.2–1.0)

## 2017-06-20 LAB — MICROSCOPIC EXAMINATION
BACTERIA UA: NONE SEEN
EPITHELIAL CELLS (NON RENAL): NONE SEEN /HPF (ref 0–10)
RENAL EPITHEL UA: NONE SEEN /HPF
WBC, UA: NONE SEEN /hpf (ref 0–?)

## 2017-06-20 MED ORDER — PREDNISONE 10 MG PO TABS: 10.0000 mg | ORAL_TABLET | Freq: Every day | ORAL | 6 refills | Status: DC

## 2017-06-20 MED ORDER — TAMSULOSIN HCL 0.4 MG PO CAPS: 0.4000 mg | ORAL_CAPSULE | Freq: Every day | ORAL | 3 refills | Status: DC

## 2017-06-20 MED ORDER — PREDNISONE 10 MG PO TABS: 10.0000 mg | ORAL_TABLET | Freq: Every day | ORAL | 1 refills | Status: DC

## 2017-06-20 NOTE — Patient Instructions (Signed)
In a few days you may receive a survey in the mail or online from [employer] regarding your visit with us today. Please take a moment to fill this out. Your feedback is very important to our whole office. It can help us better understand your needs as well as improve your experience and satisfaction. Thank you for taking your time to complete it. We care about you.  Taliya Mcclard, PA-C  

## 2017-06-20 NOTE — Progress Notes (Signed)
BP 136/86   Pulse 65   Temp 98.8 F (37.1 C) (Oral)   Ht 5\' 6"  (1.676 m)   Wt 212 lb (96.2 kg)   BMI 34.22 kg/m    Subjective:    Patient ID: Dustin Bradshaw, male    DOB: 1941-08-07, 76 y.o.   MRN: [id number]  HPI: Dustin Bradshaw is a 76 y.o. male presenting on 06/20/2017 for Follow-up and Urinary Frequency  This patient comes in for periodic recheck on medications and conditions including BPH symptoms, urinary frequency, lung cancer patient still being followed by his oncologist. He has had good reports with his lung cancer being very well controlled. He is doing well overall. He does have multiple nocturia episodes and has never taken any medication for BPH..   All medications are reviewed today. There are no reports of any problems with the medications. All of the medical conditions are reviewed and updated.  Lab work is reviewed and will be ordered as medically necessary. There are no new problems reported with today's visit.   Relevant past medical, surgical, family and social history reviewed and updated as indicated. Allergies and medications reviewed and updated.  Past Medical History:  Diagnosis Date  . High cholesterol   . Hypertension   . Pneumonia   . Primary cancer of right upper lobe of lung ([place]) 10/06/2015  . Shortness of breath dyspnea     Past Surgical History:  Procedure Laterality Date  . EYE SURGERY    . VIDEO BRONCHOSCOPY Bilateral 09/17/2015   Procedure: VIDEO BRONCHOSCOPY WITH FLUORO;  Surgeon: Collene Gobble, MD;  Location: [facility];  Service: Cardiopulmonary;  Laterality: Bilateral;  . VIDEO BRONCHOSCOPY N/A 11/23/2015   Procedure: VIDEO BRONCHOSCOPY;  Surgeon: Ivin Poot, MD;  Location: [facility];  Service: Thoracic;  Laterality: N/A;  . YAG LASER APPLICATION Left 74/25/9563   Procedure: YAG LASER APPLICATION;  Surgeon: Rutherford Guys, MD;  [facility];  Service: Ophthalmology;  Laterality: Left;    Review of Systems  Constitutional:  Negative.  Negative for appetite change and fatigue.  HENT: Negative.   Eyes: Negative.  Negative for pain and visual disturbance.  Respiratory: Negative.  Negative for cough, chest tightness, shortness of breath and wheezing.   Cardiovascular: Negative.  Negative for chest pain, palpitations and leg swelling.  Gastrointestinal: Negative.  Negative for abdominal pain, diarrhea, nausea and vomiting.  Endocrine: Negative.   Genitourinary: Negative.   Musculoskeletal: Negative.   Skin: Negative.  Negative for color change and rash.  Neurological: Negative.  Negative for weakness, numbness and headaches.  Psychiatric/Behavioral: Negative.     Allergies as of 06/20/2017   No Known Allergies     Medication List       Accurate as of 06/20/17  9:23 PM. Always use your most recent med list.          amLODipine 10 MG tablet Commonly known as:  NORVASC Take 1 tablet (10 mg total) by mouth daily.   aspirin EC 81 MG tablet Take 81 mg by mouth daily.   co-enzyme Q-10 30 MG capsule Take 30 mg by mouth 3 (three) times daily.   LORazepam 0.5 MG tablet Commonly known as:  ATIVAN Take 1 tablet (0.5 mg total) by mouth as needed.   losartan 100 MG tablet Commonly known as:  COZAAR Take 1 tablet (100 mg total) by mouth daily.   metoprolol 200 MG 24 hr tablet Commonly known as:  TOPROL-XL Take 1 tablet (200 mg  total) by mouth daily.   multivitamin tablet Take 1 tablet by mouth daily.   predniSONE 10 MG tablet Commonly known as:  DELTASONE Take 1 tablet (10 mg total) by mouth daily with breakfast.   tamsulosin 0.4 MG Caps capsule Commonly known as:  FLOMAX Take 1 capsule (0.4 mg total) by mouth daily.            Discharge Care Instructions        Start     Ordered   06/20/17 0000  Urinalysis, Complete     06/20/17 1121   06/20/17 0000  tamsulosin (FLOMAX) 0.4 MG CAPS capsule  Daily    Question:  Supervising Provider  Answer:  Timmothy Euler   06/20/17 1141    06/20/17 0000  predniSONE (DELTASONE) 10 MG tablet  Daily with breakfast    Question:  Supervising Provider  Answer:  Timmothy Euler   06/20/17 1141   06/20/17 0000  Urine Culture     06/20/17 1147   06/20/17 0000  Microscopic Examination     06/20/17 0000         Objective:    BP 136/86   Pulse 65   Temp 98.8 F (37.1 C) (Oral)   Ht 5\' 6"  (1.676 m)   Wt 212 lb (96.2 kg)   BMI 34.22 kg/m   No Known Allergies  Physical Exam  Constitutional: He appears well-developed and well-nourished.  HENT:  Head: Normocephalic and atraumatic.  Eyes: Pupils are equal, round, and reactive to light. Conjunctivae and EOM are normal.  Neck: Normal range of motion. Neck supple.  Cardiovascular: Normal rate, regular rhythm and normal heart sounds.   Pulmonary/Chest: Effort normal and breath sounds normal.  Abdominal: Soft. Bowel sounds are normal.  Musculoskeletal: Normal range of motion.  Skin: Skin is warm and dry.    Results for orders placed or performed in visit on 06/20/17  Microscopic Examination  Result Value Ref Range   WBC, UA None seen 0 - 5 /hpf   RBC, UA 3-10 (A) 0 - 2 /hpf   Epithelial Cells (non renal) None seen 0 - 10 /hpf   Renal Epithel, UA None seen None seen /hpf   Mucus, UA Present Not Estab.   Bacteria, UA None seen None seen/Few  Urinalysis, Complete  Result Value Ref Range   Specific Gravity, UA 1.010 1.005 - 1.030   pH, UA 7.0 5.0 - 7.5   Color, UA Yellow Yellow   Appearance Ur Clear Clear   Leukocytes, UA Negative Negative   Protein, UA 1+ (A) Negative/Trace   Glucose, UA Negative Negative   Ketones, UA Negative Negative   RBC, UA Trace (A) Negative   Bilirubin, UA Negative Negative   Urobilinogen, Ur 0.2 0.2 - 1.0 mg/dL   Nitrite, UA Negative Negative   Microscopic Examination See below:       Assessment & Plan:   1. Urinary frequency - Urinalysis, Complete - Urine Culture - Microscopic Examination  2. Benign prostatic hyperplasia with  nocturia - tamsulosin (FLOMAX) 0.4 MG CAPS capsule; Take 1 capsule (0.4 mg total) by mouth daily.  Dispense: 90 capsule; Refill: 3  3. Primary cancer of right upper lobe of lung ([facility]) - predniSONE (DELTASONE) 10 MG tablet; Take 1 tablet (10 mg total) by mouth daily with breakfast.  Dispense: 90 tablet; Refill: 1    Current Outpatient Prescriptions:  .  amLODipine (NORVASC) 10 MG tablet, Take 1 tablet (10 mg total) by mouth daily., Disp: 90  tablet, Rfl: 3 .  aspirin EC 81 MG tablet, Take 81 mg by mouth daily., Disp: , Rfl:  .  co-enzyme Q-10 30 MG capsule, Take 30 mg by mouth 3 (three) times daily., Disp: , Rfl:  .  LORazepam (ATIVAN) 0.5 MG tablet, Take 1 tablet (0.5 mg total) by mouth as needed., Disp: 30 tablet, Rfl: 5 .  losartan (COZAAR) 100 MG tablet, Take 1 tablet (100 mg total) by mouth daily., Disp: 90 tablet, Rfl: 3 .  metoprolol (TOPROL-XL) 200 MG 24 hr tablet, Take 1 tablet (200 mg total) by mouth daily., Disp: 90 tablet, Rfl: 3 .  Multiple Vitamin (MULTIVITAMIN) tablet, Take 1 tablet by mouth daily., Disp: , Rfl:  .  predniSONE (DELTASONE) 10 MG tablet, Take 1 tablet (10 mg total) by mouth daily with breakfast., Disp: 90 tablet, Rfl: 1 .  tamsulosin (FLOMAX) 0.4 MG CAPS capsule, Take 1 capsule (0.4 mg total) by mouth daily., Disp: 90 capsule, Rfl: 3 Continue all other maintenance medications as listed above.  Follow up plan: Return in about 3 months (around 09/19/2017) for recheck.  Educational handout given for [facility] PA-C [facility] [address]  [place], [place] [address] [phone]   06/20/2017, 9:23 PM

## 2017-06-21 ENCOUNTER — Ambulatory Visit: Payer: PPO | Admitting: Physician Assistant

## 2017-06-21 LAB — URINE CULTURE

## 2017-06-26 ENCOUNTER — Telehealth: Payer: Self-pay | Admitting: Physician Assistant

## 2017-06-26 NOTE — Telephone Encounter (Signed)
Patient states that flomax is not helping and would like for something different to be sen to pharmacy

## 2017-06-27 ENCOUNTER — Telehealth: Payer: Self-pay

## 2017-06-27 NOTE — Telephone Encounter (Signed)
Please review phone call from yesterday- it looks like it was not routed to you yesterday.

## 2017-08-18 ENCOUNTER — Ambulatory Visit: Payer: PPO | Admitting: Family Medicine

## 2017-08-18 ENCOUNTER — Telehealth: Payer: Self-pay

## 2017-08-18 ENCOUNTER — Encounter: Payer: Self-pay | Admitting: Family Medicine

## 2017-08-18 VITALS — BP 112/74 | HR 54 | Temp 98.9°F | Ht 66.0 in | Wt 211.0 lb

## 2017-08-18 DIAGNOSIS — R351 Nocturia: Principal | ICD-10-CM

## 2017-08-18 DIAGNOSIS — J189 Pneumonia, unspecified organism: Secondary | ICD-10-CM | POA: Diagnosis not present

## 2017-08-18 DIAGNOSIS — J441 Chronic obstructive pulmonary disease with (acute) exacerbation: Secondary | ICD-10-CM | POA: Diagnosis not present

## 2017-08-18 DIAGNOSIS — N401 Enlarged prostate with lower urinary tract symptoms: Secondary | ICD-10-CM

## 2017-08-18 MED ORDER — AMOXICILLIN-POT CLAVULANATE 875-125 MG PO TABS: 1.0000 | ORAL_TABLET | Freq: Two times a day (BID) | ORAL | 0 refills | Status: DC

## 2017-08-18 MED ORDER — PREDNISONE 20 MG PO TABS: ORAL_TABLET | ORAL | 0 refills | Status: AC

## 2017-08-18 NOTE — Progress Notes (Signed)
BP 112/74   Pulse (!) 54   Temp 98.9 F (37.2 C) (Oral)   Ht 5\' 6"  (1.676 m)   Wt 211 lb (95.7 kg)   BMI 34.06 kg/m    Subjective:    Patient ID: Dustin Bradshaw, male    DOB: 02/20/1941, 76 y.o.   MRN: [id number]  HPI: Dustin Bradshaw is a 76 y.o. male presenting on 08/18/2017 for Chest congestion, sinus congestion, cough (has lung cancer, sees oncologist next week)   HPI Cough and sinus congestion and drainage Patient comes in complaining of cough and sinus congestion and postnasal drainage and chest congestion that has been causing him problems over the past week.  He is brought in by his wife today because she says that he will let this go too long and he has been dealing with this before and had a severe pneumonia that had him hospitalized at one point and he was diagnosed with lung cancer which they are watching with observation right now.  He feels like he does have some wheezing but no shortness of breath  Relevant past medical, surgical, family and social history reviewed and updated as indicated. Interim medical history since our last visit reviewed. Allergies and medications reviewed and updated.  Review of Systems  Constitutional: Negative for chills and fever.  HENT: Positive for congestion, postnasal drip, rhinorrhea, sinus pressure, sneezing and sore throat. Negative for ear discharge, ear pain and voice change.   Eyes: Negative for pain, discharge, redness and visual disturbance.  Respiratory: Positive for cough, chest tightness and wheezing. Negative for shortness of breath.   Cardiovascular: Negative for chest pain and leg swelling.  Musculoskeletal: Negative for gait problem.  Skin: Negative for rash.  All other systems reviewed and are negative.   Per HPI unless specifically indicated above        Objective:    BP 112/74   Pulse (!) 54   Temp 98.9 F (37.2 C) (Oral)   Ht 5\' 6"  (1.676 m)   Wt 211 lb (95.7 kg)   BMI 34.06 kg/m   Wt Readings from Last  3 Encounters:  08/18/17 211 lb (95.7 kg)  06/20/17 212 lb (96.2 kg)  03/14/17 211 lb 12.8 oz (96.1 kg)    Physical Exam  Constitutional: He is oriented to person, place, and time. He appears well-developed and well-nourished. No distress.  HENT:  Right Ear: Tympanic membrane, external ear and ear canal normal.  Left Ear: Tympanic membrane, external ear and ear canal normal.  Nose: Mucosal edema and rhinorrhea present. No sinus tenderness. No epistaxis. Right sinus exhibits maxillary sinus tenderness. Right sinus exhibits no frontal sinus tenderness. Left sinus exhibits maxillary sinus tenderness. Left sinus exhibits no frontal sinus tenderness.  Mouth/Throat: Uvula is midline and mucous membranes are normal. Posterior oropharyngeal edema present. No oropharyngeal exudate, posterior oropharyngeal erythema or tonsillar abscesses.  Eyes: Conjunctivae and EOM are normal. Pupils are equal, round, and reactive to light. No scleral icterus.  Neck: Neck supple. No thyromegaly present.  Cardiovascular: Normal rate, regular rhythm, normal heart sounds and intact distal pulses.  No murmur heard. Pulmonary/Chest: Effort normal. No respiratory distress. He has no decreased breath sounds. He has no wheezes. He has rhonchi (Patient has rhonchi and stridor). He has no rales.  Musculoskeletal: Normal range of motion. He exhibits no edema.  Lymphadenopathy:    He has no cervical adenopathy.  Neurological: He is alert and oriented to person, place, and time. Coordination normal.  Skin: Skin  is warm and dry. No rash noted. He is not diaphoretic.  Psychiatric: He has a normal mood and affect. His behavior is normal.  Nursing note and vitals reviewed.       Assessment & Plan:   Problem List Items Addressed This Visit    None    Visit Diagnoses    Atypical pneumonia    -  Primary   Relevant Medications   amoxicillin-clavulanate (AUGMENTIN) 875-125 MG tablet   predniSONE (DELTASONE) 20 MG tablet   COPD  exacerbation (HCC)       Relevant Medications   predniSONE (DELTASONE) 20 MG tablet       Follow up plan: Return if symptoms worsen or fail to improve.  Counseling provided for all of the vaccine components No orders of the defined types were placed in this encounter.   Caryl Pina, MD [facility] 08/18/2017, 9:56 AM

## 2017-08-18 NOTE — Telephone Encounter (Signed)
[name] had left a message in September but never heard back from [place].  They are reporting the Flomax did not seem to help when he started it, was having frequent night time trips to urinate.  Dustin Bradshaw reports it has improved some since then.  They would like to know if he should continue with the Flomax or do something else.

## 2017-08-19 MED ORDER — TAMSULOSIN HCL 0.4 MG PO CAPS: 0.8000 mg | ORAL_CAPSULE | Freq: Every day | ORAL | 3 refills | Status: DC

## 2017-08-22 DIAGNOSIS — C3411 Malignant neoplasm of upper lobe, right bronchus or lung: Secondary | ICD-10-CM | POA: Diagnosis not present

## 2017-08-24 DIAGNOSIS — C3411 Malignant neoplasm of upper lobe, right bronchus or lung: Secondary | ICD-10-CM | POA: Diagnosis not present

## 2017-08-24 DIAGNOSIS — C7971 Secondary malignant neoplasm of right adrenal gland: Secondary | ICD-10-CM | POA: Diagnosis not present

## 2017-08-27 ENCOUNTER — Other Ambulatory Visit (HOSPITAL_COMMUNITY): Payer: Self-pay | Admitting: Radiation Oncology

## 2017-08-27 DIAGNOSIS — C3411 Malignant neoplasm of upper lobe, right bronchus or lung: Secondary | ICD-10-CM | POA: Diagnosis not present

## 2017-08-28 DIAGNOSIS — C3411 Malignant neoplasm of upper lobe, right bronchus or lung: Secondary | ICD-10-CM | POA: Diagnosis not present

## 2017-08-28 NOTE — Telephone Encounter (Signed)
Patients wife aware

## 2017-08-29 DIAGNOSIS — C3411 Malignant neoplasm of upper lobe, right bronchus or lung: Secondary | ICD-10-CM | POA: Diagnosis not present

## 2017-09-03 DIAGNOSIS — C3411 Malignant neoplasm of upper lobe, right bronchus or lung: Secondary | ICD-10-CM | POA: Diagnosis not present

## 2017-09-05 ENCOUNTER — Encounter (HOSPITAL_COMMUNITY): Payer: Self-pay

## 2017-09-05 ENCOUNTER — Ambulatory Visit (HOSPITAL_COMMUNITY): Payer: PPO

## 2017-09-10 DIAGNOSIS — C3411 Malignant neoplasm of upper lobe, right bronchus or lung: Secondary | ICD-10-CM | POA: Diagnosis not present

## 2017-09-11 DIAGNOSIS — C3490 Malignant neoplasm of unspecified part of unspecified bronchus or lung: Secondary | ICD-10-CM | POA: Diagnosis not present

## 2017-09-11 DIAGNOSIS — C3411 Malignant neoplasm of upper lobe, right bronchus or lung: Secondary | ICD-10-CM | POA: Diagnosis not present

## 2017-09-13 ENCOUNTER — Telehealth: Payer: Self-pay | Admitting: Emergency Medicine

## 2017-09-13 DIAGNOSIS — R0600 Dyspnea, unspecified: Secondary | ICD-10-CM | POA: Diagnosis not present

## 2017-09-13 DIAGNOSIS — C3411 Malignant neoplasm of upper lobe, right bronchus or lung: Secondary | ICD-10-CM | POA: Diagnosis not present

## 2017-09-13 DIAGNOSIS — J449 Chronic obstructive pulmonary disease, unspecified: Secondary | ICD-10-CM | POA: Diagnosis not present

## 2017-09-13 NOTE — Telephone Encounter (Signed)
I called Dr. Raliegh Ip office to let them know RB will call him on Monday. WIll route back to RB for a reminder to call.

## 2017-09-13 NOTE — Telephone Encounter (Signed)
I am not available on 12/7 but will be here on 12/10 and would be happy to speak with him then.

## 2017-09-13 NOTE — Telephone Encounter (Signed)
Will forward to RB so that he may call this provider tomorrow.

## 2017-09-14 ENCOUNTER — Encounter (HOSPITAL_COMMUNITY)
Admission: RE | Admit: 2017-09-14 | Discharge: 2017-09-14 | Disposition: A | Discharge status: Home / Self Care | Payer: PPO | Source: Ambulatory Visit | Attending: Radiation Oncology | Admitting: Radiation Oncology

## 2017-09-14 DIAGNOSIS — R0600 Dyspnea, unspecified: Secondary | ICD-10-CM | POA: Diagnosis not present

## 2017-09-14 DIAGNOSIS — C7971 Secondary malignant neoplasm of right adrenal gland: Secondary | ICD-10-CM | POA: Diagnosis not present

## 2017-09-14 DIAGNOSIS — C7972 Secondary malignant neoplasm of left adrenal gland: Secondary | ICD-10-CM | POA: Diagnosis not present

## 2017-09-14 DIAGNOSIS — J449 Chronic obstructive pulmonary disease, unspecified: Secondary | ICD-10-CM | POA: Diagnosis not present

## 2017-09-14 DIAGNOSIS — C771 Secondary and unspecified malignant neoplasm of intrathoracic lymph nodes: Secondary | ICD-10-CM | POA: Diagnosis not present

## 2017-09-14 DIAGNOSIS — C3411 Malignant neoplasm of upper lobe, right bronchus or lung: Secondary | ICD-10-CM | POA: Diagnosis not present

## 2017-09-14 LAB — GLUCOSE, CAPILLARY: Glucose-Capillary: 105 mg/dL — ABNORMAL HIGH (ref 65–99)

## 2017-09-14 MED ORDER — FLUDEOXYGLUCOSE F - 18 (FDG) INJECTION
12.0000 | Freq: Once | INTRAVENOUS | Status: AC | PRN
  Administered 2017-09-14: 12 via INTRAVENOUS

## 2017-09-19 NOTE — Telephone Encounter (Signed)
Discussed case with Dr Raliegh Ip -   Mr Dustin Bradshaw 2 rounds palliative XRT, now w more dyspnea. 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 Repeat XRT done with some clinical improvement.   He may need repeat FOB for tissue to facilitate targeted therapy, may also be a candidate for stenting. I can do the former. He would need to go to [facility] to discuss potential stenting with them.   Please set him up with an OV with me. Thanks.

## 2017-09-20 NOTE — Telephone Encounter (Signed)
Spoke with Dustin Bradshaw and she made an appt on 10/16/17 with RB but wanted to know if we could move the appt up sooner. I looked through RB schedule and did not see any openings. Ria Comment any suggestions? Maybe we can overbook somewhere?

## 2017-09-20 NOTE — Telephone Encounter (Signed)
Unfortunately, we can't over book. They are welcome to call back to see if anyone has canceled if they want.

## 2017-09-20 NOTE — Telephone Encounter (Signed)
Spoke with pt's spouse, aware of recs.  Nothing further needed at this time.

## 2017-09-21 ENCOUNTER — Encounter: Payer: Self-pay | Admitting: Physician Assistant

## 2017-09-21 ENCOUNTER — Ambulatory Visit: Payer: PPO | Admitting: Physician Assistant

## 2017-09-21 VITALS — BP 124/84 | HR 119 | Temp 98.0°F | Ht 66.0 in | Wt 210.8 lb

## 2017-09-21 DIAGNOSIS — R351 Nocturia: Secondary | ICD-10-CM | POA: Diagnosis not present

## 2017-09-21 DIAGNOSIS — F419 Anxiety disorder, unspecified: Secondary | ICD-10-CM | POA: Diagnosis not present

## 2017-09-21 DIAGNOSIS — N401 Enlarged prostate with lower urinary tract symptoms: Secondary | ICD-10-CM

## 2017-09-21 DIAGNOSIS — C3481 Malignant neoplasm of overlapping sites of right bronchus and lung: Secondary | ICD-10-CM | POA: Diagnosis not present

## 2017-09-21 DIAGNOSIS — R0902 Hypoxemia: Secondary | ICD-10-CM

## 2017-09-21 DIAGNOSIS — J438 Other emphysema: Secondary | ICD-10-CM | POA: Diagnosis not present

## 2017-09-21 DIAGNOSIS — I10 Essential (primary) hypertension: Secondary | ICD-10-CM

## 2017-09-21 DIAGNOSIS — C3411 Malignant neoplasm of upper lobe, right bronchus or lung: Secondary | ICD-10-CM

## 2017-09-21 MED ORDER — LORAZEPAM 1 MG PO TABS: 1.0000 mg | ORAL_TABLET | ORAL | 5 refills | Status: AC | PRN

## 2017-09-21 MED ORDER — AMLODIPINE BESYLATE 10 MG PO TABS: 10.0000 mg | ORAL_TABLET | Freq: Every day | ORAL | 3 refills | Status: DC

## 2017-09-21 MED ORDER — METOPROLOL SUCCINATE ER 200 MG PO TB24: 200.0000 mg | ORAL_TABLET | Freq: Every day | ORAL | 3 refills | Status: DC

## 2017-09-21 MED ORDER — LOSARTAN POTASSIUM 100 MG PO TABS: 100.0000 mg | ORAL_TABLET | Freq: Every day | ORAL | 3 refills | Status: DC

## 2017-09-21 MED ORDER — ALBUTEROL SULFATE HFA 108 (90 BASE) MCG/ACT IN AERS: 2.0000 | INHALATION_SPRAY | Freq: Four times a day (QID) | RESPIRATORY_TRACT | 6 refills | Status: DC | PRN

## 2017-09-21 NOTE — Patient Instructions (Signed)
In a few days you may receive a survey in the mail or online from [employer] regarding your visit with us today. Please take a moment to fill this out. Your feedback is very important to our whole office. It can help us better understand your needs as well as improve your experience and satisfaction. Thank you for taking your time to complete it. We care about you.  Sisto Granillo, PA-C  

## 2017-09-21 NOTE — Progress Notes (Signed)
BP 124/84   Pulse (!) 119   Temp 98 F (36.7 C) (Oral)   Ht 5\' 6"  (1.676 m)   Wt 210 lb 12.8 oz (95.6 kg)   SpO2 (!) 88% Comment: walking  BMI 34.02 kg/m    Subjective:    Patient ID: Dustin Bradshaw, male    DOB: [birth year], 76 y.o.   MRN: [id number]  HPI: Dustin Bradshaw is a 76 y.o. male presenting on 09/21/2017 for Follow-up (3 month )  Patient comes in for 30-month follow-up on medical conditions.  He does have lung cancer that has returned.  He is being seen through [facility] oncology in [facility].  He has finished his radiation and will be discussing the possibility of chemotherapy.  He is quite dyspneic on exertion at all times now.  He even has wheezing while sitting.  Today in our office he had oxygen of 92 while sitting and after walking it lowered to 88 and he became symptomatic more than normal.  We will put an order in for home oxygen and portable oxygen.  He will need to be on it 24 hours.  Relevant past medical, surgical, family and social history reviewed and updated as indicated. Allergies and medications reviewed and updated.  Past Medical History:  Diagnosis Date  . High cholesterol   . Hypertension   . Pneumonia   . Primary cancer of right upper lobe of lung ([place]) 10/06/2015  . Shortness of breath dyspnea     Past Surgical History:  Procedure Laterality Date  . EYE SURGERY    . VIDEO BRONCHOSCOPY Bilateral 09/17/2015   Procedure: VIDEO BRONCHOSCOPY WITH FLUORO;  Surgeon: Collene Gobble, MD;  Location: [place];  Service: Cardiopulmonary;  Laterality: Bilateral;  . VIDEO BRONCHOSCOPY N/A 11/23/2015   Procedure: VIDEO BRONCHOSCOPY;  Surgeon: Ivin Poot, MD;  Location: [place];  Service: Thoracic;  Laterality: N/A;  . YAG LASER APPLICATION Left 16/38/4665   Procedure: YAG LASER APPLICATION;  Surgeon: Rutherford Guys, MD;  Location: [facility];  Service: Ophthalmology;  Laterality: Left;    Review of Systems  Constitutional: Negative.  Negative for appetite  change and fatigue.  HENT: Negative.   Eyes: Negative.  Negative for pain and visual disturbance.  Respiratory: Positive for cough, shortness of breath and wheezing. Negative for chest tightness.   Cardiovascular: Negative.  Negative for chest pain, palpitations and leg swelling.  Gastrointestinal: Negative.  Negative for abdominal pain, diarrhea, nausea and vomiting.  Endocrine: Negative.   Genitourinary: Negative.   Musculoskeletal: Negative.   Skin: Negative.  Negative for color change and rash.  Neurological: Negative.  Negative for weakness, numbness and headaches.  Psychiatric/Behavioral: Negative.     Allergies as of 09/21/2017   No Known Allergies     Medication List        Accurate as of 09/21/17  2:17 PM. Always use your most recent med list.          albuterol 108 (90 Base) MCG/ACT inhaler Commonly known as:  PROVENTIL HFA;VENTOLIN HFA Inhale 2 puffs into the lungs every 6 (six) hours as needed for wheezing or shortness of breath.   amLODipine 10 MG tablet Commonly known as:  NORVASC Take 1 tablet (10 mg total) by mouth daily.   aspirin EC 81 MG tablet Take 81 mg by mouth daily.   co-enzyme Q-10 30 MG capsule Take 30 mg by mouth 3 (three) times daily.   LORazepam 1 MG tablet Commonly known as:  ATIVAN Take 1 tablet (1 mg total) by mouth as needed.   losartan 100 MG tablet Commonly known as:  COZAAR Take 1 tablet (100 mg total) by mouth daily.   metoprolol 200 MG 24 hr tablet Commonly known as:  TOPROL-XL Take 1 tablet (200 mg total) by mouth daily.   multivitamin tablet Take 1 tablet by mouth daily.   predniSONE 20 MG tablet Commonly known as:  DELTASONE Take 3 tabs daily for 1 week, then 2 tabs daily for week 2, then 1 tab daily for week 3.   tamsulosin 0.4 MG Caps capsule Commonly known as:  FLOMAX Take 2 capsules (0.8 mg total) daily by mouth.            Durable Medical Equipment  (From admission, onward)        Start     Ordered     09/21/17 0000  For home use only DME oxygen    Question Answer Comment  Mode or (Route) Nasal cannula   Liters per Minute 2   Frequency Continuous (stationary and portable oxygen unit needed)   Oxygen conserving device Yes      09/21/17 1054         Objective:    BP 124/84   Pulse (!) 119   Temp 98 F (36.7 C) (Oral)   Ht 5\' 6"  (1.676 m)   Wt 210 lb 12.8 oz (95.6 kg)   SpO2 (!) 88% Comment: walking  BMI 34.02 kg/m   No Known Allergies  Physical Exam  Constitutional: He appears well-developed and well-nourished.  HENT:  Head: Normocephalic and atraumatic.  Eyes: Conjunctivae and EOM are normal. Pupils are equal, round, and reactive to light.  Neck: Normal range of motion. Neck supple.  Cardiovascular: Normal rate, regular rhythm and normal heart sounds.  Pulmonary/Chest: Effort normal. He has decreased breath sounds in the right upper field and the right middle field. He has wheezes in the right upper field and the right middle field.      Abdominal: Soft. Bowel sounds are normal.  Musculoskeletal: Normal range of motion.  Skin: Skin is warm and dry.    Results for orders placed or performed during the hospital encounter of 09/14/17  Glucose, capillary  Result Value Ref Range   Glucose-Capillary 105 (H) 65 - 99 mg/dL      Assessment & Plan:   1. Primary cancer of right upper lobe of lung ([place]) - For home use only DME oxygen  2. Essential hypertension - metoprolol (TOPROL-XL) 200 MG 24 hr tablet; Take 1 tablet (200 mg total) by mouth daily.  Dispense: 90 tablet; Refill: 3 - losartan (COZAAR) 100 MG tablet; Take 1 tablet (100 mg total) by mouth daily.  Dispense: 90 tablet; Refill: 3 - amLODipine (NORVASC) 10 MG tablet; Take 1 tablet (10 mg total) by mouth daily.  Dispense: 90 tablet; Refill: 3  3. Anxiety - LORazepam (ATIVAN) 1 MG tablet; Take 1 tablet (1 mg total) by mouth as needed.  Dispense: 40 tablet; Refill: 5  4. Other emphysema ([place]) - For home  use only DME oxygen  5. Benign prostatic hyperplasia with nocturia  6. Hypoxemia - For home use only DME oxygen    Current Outpatient Medications:  .  amLODipine (NORVASC) 10 MG tablet, Take 1 tablet (10 mg total) by mouth daily., Disp: 90 tablet, Rfl: 3 .  aspirin EC 81 MG tablet, Take 81 mg by mouth daily., Disp: , Rfl:  .  co-enzyme Q-10 30 MG  capsule, Take 30 mg by mouth 3 (three) times daily., Disp: , Rfl:  .  LORazepam (ATIVAN) 1 MG tablet, Take 1 tablet (1 mg total) by mouth as needed., Disp: 40 tablet, Rfl: 5 .  losartan (COZAAR) 100 MG tablet, Take 1 tablet (100 mg total) by mouth daily., Disp: 90 tablet, Rfl: 3 .  metoprolol (TOPROL-XL) 200 MG 24 hr tablet, Take 1 tablet (200 mg total) by mouth daily., Disp: 90 tablet, Rfl: 3 .  Multiple Vitamin (MULTIVITAMIN) tablet, Take 1 tablet by mouth daily., Disp: , Rfl:  .  predniSONE (DELTASONE) 20 MG tablet, Take 3 tabs daily for 1 week, then 2 tabs daily for week 2, then 1 tab daily for week 3., Disp: 42 tablet, Rfl: 0 .  tamsulosin (FLOMAX) 0.4 MG CAPS capsule, Take 2 capsules (0.8 mg total) daily by mouth., Disp: 180 capsule, Rfl: 3 .  albuterol (PROVENTIL HFA;VENTOLIN HFA) 108 (90 Base) MCG/ACT inhaler, Inhale 2 puffs into the lungs every 6 (six) hours as needed for wheezing or shortness of breath., Disp: 1 Inhaler, Rfl: 6 Continue all other maintenance medications as listed above.  Follow up plan: Return in about 6 months (around 03/22/2018) for recheck.  Educational handout given for [place] PA-C [facility] [address]  [place], [place] [address] [phone]   09/21/2017, 2:17 PM

## 2017-09-25 DIAGNOSIS — C3411 Malignant neoplasm of upper lobe, right bronchus or lung: Secondary | ICD-10-CM | POA: Diagnosis not present

## 2017-09-28 ENCOUNTER — Other Ambulatory Visit: Payer: Self-pay | Admitting: *Deleted

## 2017-09-28 NOTE — Progress Notes (Signed)
Pt came in for O2 check with exertion At rest SpO2 92%    P 92 After exertion  SpO2  86%   P   109 With O2 at 3 lpm with exertion    SpO2  92   P 98 After sitting with O2 at 3lpm  SpO2  94   P 89

## 2017-10-04 ENCOUNTER — Telehealth: Payer: Self-pay | Admitting: Physician Assistant

## 2017-10-04 DIAGNOSIS — C3411 Malignant neoplasm of upper lobe, right bronchus or lung: Secondary | ICD-10-CM | POA: Diagnosis not present

## 2017-10-04 NOTE — Telephone Encounter (Signed)
Patient aware both where sent 09/21/17.

## 2017-10-05 DIAGNOSIS — Z7952 Long term (current) use of systemic steroids: Secondary | ICD-10-CM | POA: Diagnosis not present

## 2017-10-05 DIAGNOSIS — E785 Hyperlipidemia, unspecified: Secondary | ICD-10-CM | POA: Diagnosis not present

## 2017-10-05 DIAGNOSIS — N400 Enlarged prostate without lower urinary tract symptoms: Secondary | ICD-10-CM | POA: Diagnosis not present

## 2017-10-05 DIAGNOSIS — I10 Essential (primary) hypertension: Secondary | ICD-10-CM | POA: Diagnosis not present

## 2017-10-05 DIAGNOSIS — J449 Chronic obstructive pulmonary disease, unspecified: Secondary | ICD-10-CM | POA: Diagnosis not present

## 2017-10-05 DIAGNOSIS — Z79899 Other long term (current) drug therapy: Secondary | ICD-10-CM | POA: Diagnosis not present

## 2017-10-05 DIAGNOSIS — R0602 Shortness of breath: Secondary | ICD-10-CM | POA: Diagnosis not present

## 2017-10-05 DIAGNOSIS — C3411 Malignant neoplasm of upper lobe, right bronchus or lung: Secondary | ICD-10-CM | POA: Diagnosis not present

## 2017-10-05 DIAGNOSIS — Z809 Family history of malignant neoplasm, unspecified: Secondary | ICD-10-CM | POA: Diagnosis not present

## 2017-10-05 DIAGNOSIS — Z7982 Long term (current) use of aspirin: Secondary | ICD-10-CM | POA: Diagnosis not present

## 2017-10-08 DIAGNOSIS — C3411 Malignant neoplasm of upper lobe, right bronchus or lung: Secondary | ICD-10-CM | POA: Diagnosis not present

## 2017-10-08 DIAGNOSIS — Z4682 Encounter for fitting and adjustment of non-vascular catheter: Secondary | ICD-10-CM | POA: Diagnosis not present

## 2017-10-16 ENCOUNTER — Institutional Professional Consult (permissible substitution): Payer: PPO | Admitting: Emergency Medicine

## 2017-10-17 DIAGNOSIS — N400 Enlarged prostate without lower urinary tract symptoms: Secondary | ICD-10-CM | POA: Diagnosis not present

## 2017-10-17 DIAGNOSIS — C779 Secondary and unspecified malignant neoplasm of lymph node, unspecified: Secondary | ICD-10-CM | POA: Diagnosis not present

## 2017-10-17 DIAGNOSIS — J9811 Atelectasis: Secondary | ICD-10-CM | POA: Diagnosis not present

## 2017-10-17 DIAGNOSIS — J189 Pneumonia, unspecified organism: Secondary | ICD-10-CM | POA: Diagnosis not present

## 2017-10-17 DIAGNOSIS — I10 Essential (primary) hypertension: Secondary | ICD-10-CM | POA: Diagnosis not present

## 2017-10-17 DIAGNOSIS — C3481 Malignant neoplasm of overlapping sites of right bronchus and lung: Secondary | ICD-10-CM | POA: Diagnosis not present

## 2017-10-17 DIAGNOSIS — R69 Illness, unspecified: Secondary | ICD-10-CM | POA: Diagnosis not present

## 2017-10-17 DIAGNOSIS — E279 Disorder of adrenal gland, unspecified: Secondary | ICD-10-CM | POA: Diagnosis not present

## 2017-10-17 DIAGNOSIS — C3411 Malignant neoplasm of upper lobe, right bronchus or lung: Secondary | ICD-10-CM | POA: Diagnosis not present

## 2017-10-17 DIAGNOSIS — C7931 Secondary malignant neoplasm of brain: Secondary | ICD-10-CM | POA: Diagnosis not present

## 2017-10-17 DIAGNOSIS — J440 Chronic obstructive pulmonary disease with acute lower respiratory infection: Secondary | ICD-10-CM | POA: Diagnosis not present

## 2017-10-23 DIAGNOSIS — C3411 Malignant neoplasm of upper lobe, right bronchus or lung: Secondary | ICD-10-CM | POA: Diagnosis not present

## 2017-10-23 DIAGNOSIS — Z5111 Encounter for antineoplastic chemotherapy: Secondary | ICD-10-CM | POA: Diagnosis not present

## 2017-10-26 DIAGNOSIS — C3411 Malignant neoplasm of upper lobe, right bronchus or lung: Secondary | ICD-10-CM | POA: Diagnosis not present

## 2017-10-30 DIAGNOSIS — C3411 Malignant neoplasm of upper lobe, right bronchus or lung: Secondary | ICD-10-CM | POA: Diagnosis not present

## 2017-10-31 DIAGNOSIS — C3411 Malignant neoplasm of upper lobe, right bronchus or lung: Secondary | ICD-10-CM | POA: Diagnosis not present

## 2017-10-31 DIAGNOSIS — Z5111 Encounter for antineoplastic chemotherapy: Secondary | ICD-10-CM | POA: Diagnosis not present

## 2017-11-01 DIAGNOSIS — J438 Other emphysema: Secondary | ICD-10-CM | POA: Diagnosis not present

## 2017-11-02 DIAGNOSIS — C3411 Malignant neoplasm of upper lobe, right bronchus or lung: Secondary | ICD-10-CM | POA: Diagnosis not present

## 2017-11-05 ENCOUNTER — Institutional Professional Consult (permissible substitution): Payer: PPO | Admitting: Emergency Medicine

## 2017-11-07 DIAGNOSIS — J449 Chronic obstructive pulmonary disease, unspecified: Secondary | ICD-10-CM | POA: Diagnosis not present

## 2017-11-07 DIAGNOSIS — I10 Essential (primary) hypertension: Secondary | ICD-10-CM | POA: Diagnosis not present

## 2017-11-07 DIAGNOSIS — R590 Localized enlarged lymph nodes: Secondary | ICD-10-CM | POA: Diagnosis not present

## 2017-11-07 DIAGNOSIS — E279 Disorder of adrenal gland, unspecified: Secondary | ICD-10-CM | POA: Diagnosis not present

## 2017-11-07 DIAGNOSIS — R51 Headache: Secondary | ICD-10-CM | POA: Diagnosis not present

## 2017-11-07 DIAGNOSIS — R12 Heartburn: Secondary | ICD-10-CM | POA: Diagnosis not present

## 2017-11-07 DIAGNOSIS — J9811 Atelectasis: Secondary | ICD-10-CM | POA: Diagnosis not present

## 2017-11-07 DIAGNOSIS — I2510 Atherosclerotic heart disease of native coronary artery without angina pectoris: Secondary | ICD-10-CM | POA: Diagnosis not present

## 2017-11-07 DIAGNOSIS — C3411 Malignant neoplasm of upper lobe, right bronchus or lung: Secondary | ICD-10-CM | POA: Diagnosis not present

## 2017-11-07 DIAGNOSIS — K760 Fatty (change of) liver, not elsewhere classified: Secondary | ICD-10-CM | POA: Diagnosis not present

## 2017-11-09 DIAGNOSIS — C3411 Malignant neoplasm of upper lobe, right bronchus or lung: Secondary | ICD-10-CM | POA: Diagnosis not present

## 2017-11-09 DIAGNOSIS — Z5111 Encounter for antineoplastic chemotherapy: Secondary | ICD-10-CM | POA: Diagnosis not present

## 2017-11-15 DIAGNOSIS — C3411 Malignant neoplasm of upper lobe, right bronchus or lung: Secondary | ICD-10-CM | POA: Diagnosis not present

## 2017-11-21 DIAGNOSIS — Z720 Tobacco use: Secondary | ICD-10-CM | POA: Diagnosis not present

## 2017-11-21 DIAGNOSIS — R590 Localized enlarged lymph nodes: Secondary | ICD-10-CM | POA: Diagnosis not present

## 2017-11-21 DIAGNOSIS — I2510 Atherosclerotic heart disease of native coronary artery without angina pectoris: Secondary | ICD-10-CM | POA: Diagnosis not present

## 2017-11-21 DIAGNOSIS — R51 Headache: Secondary | ICD-10-CM | POA: Diagnosis not present

## 2017-11-21 DIAGNOSIS — R12 Heartburn: Secondary | ICD-10-CM | POA: Diagnosis not present

## 2017-11-21 DIAGNOSIS — J449 Chronic obstructive pulmonary disease, unspecified: Secondary | ICD-10-CM | POA: Diagnosis not present

## 2017-11-21 DIAGNOSIS — C3411 Malignant neoplasm of upper lobe, right bronchus or lung: Secondary | ICD-10-CM | POA: Diagnosis not present

## 2017-11-21 DIAGNOSIS — I10 Essential (primary) hypertension: Secondary | ICD-10-CM | POA: Diagnosis not present

## 2017-11-21 DIAGNOSIS — E279 Disorder of adrenal gland, unspecified: Secondary | ICD-10-CM | POA: Diagnosis not present

## 2017-11-21 DIAGNOSIS — E785 Hyperlipidemia, unspecified: Secondary | ICD-10-CM | POA: Diagnosis not present

## 2017-11-23 DIAGNOSIS — C3411 Malignant neoplasm of upper lobe, right bronchus or lung: Secondary | ICD-10-CM | POA: Diagnosis not present

## 2017-11-23 DIAGNOSIS — Z5111 Encounter for antineoplastic chemotherapy: Secondary | ICD-10-CM | POA: Diagnosis not present

## 2017-11-28 DIAGNOSIS — I2510 Atherosclerotic heart disease of native coronary artery without angina pectoris: Secondary | ICD-10-CM | POA: Diagnosis not present

## 2017-11-28 DIAGNOSIS — E785 Hyperlipidemia, unspecified: Secondary | ICD-10-CM | POA: Diagnosis not present

## 2017-11-28 DIAGNOSIS — J449 Chronic obstructive pulmonary disease, unspecified: Secondary | ICD-10-CM | POA: Diagnosis not present

## 2017-11-28 DIAGNOSIS — R12 Heartburn: Secondary | ICD-10-CM | POA: Diagnosis not present

## 2017-11-28 DIAGNOSIS — N400 Enlarged prostate without lower urinary tract symptoms: Secondary | ICD-10-CM | POA: Diagnosis not present

## 2017-11-28 DIAGNOSIS — Z09 Encounter for follow-up examination after completed treatment for conditions other than malignant neoplasm: Secondary | ICD-10-CM | POA: Diagnosis not present

## 2017-11-28 DIAGNOSIS — T451X5A Adverse effect of antineoplastic and immunosuppressive drugs, initial encounter: Secondary | ICD-10-CM | POA: Diagnosis not present

## 2017-11-28 DIAGNOSIS — C7970 Secondary malignant neoplasm of unspecified adrenal gland: Secondary | ICD-10-CM | POA: Diagnosis not present

## 2017-11-28 DIAGNOSIS — R432 Parageusia: Secondary | ICD-10-CM | POA: Diagnosis not present

## 2017-11-28 DIAGNOSIS — C3411 Malignant neoplasm of upper lobe, right bronchus or lung: Secondary | ICD-10-CM | POA: Diagnosis not present

## 2017-11-28 DIAGNOSIS — I10 Essential (primary) hypertension: Secondary | ICD-10-CM | POA: Diagnosis not present

## 2017-11-30 ENCOUNTER — Other Ambulatory Visit (HOSPITAL_COMMUNITY): Payer: Self-pay | Admitting: Oncology

## 2017-11-30 DIAGNOSIS — Z5111 Encounter for antineoplastic chemotherapy: Secondary | ICD-10-CM | POA: Diagnosis not present

## 2017-11-30 DIAGNOSIS — Z79899 Other long term (current) drug therapy: Secondary | ICD-10-CM | POA: Diagnosis not present

## 2017-11-30 DIAGNOSIS — C3411 Malignant neoplasm of upper lobe, right bronchus or lung: Secondary | ICD-10-CM | POA: Diagnosis not present

## 2017-12-02 DIAGNOSIS — J438 Other emphysema: Secondary | ICD-10-CM | POA: Diagnosis not present

## 2017-12-03 ENCOUNTER — Institutional Professional Consult (permissible substitution): Payer: PPO | Admitting: Emergency Medicine

## 2017-12-05 DIAGNOSIS — Z09 Encounter for follow-up examination after completed treatment for conditions other than malignant neoplasm: Secondary | ICD-10-CM | POA: Diagnosis not present

## 2017-12-05 DIAGNOSIS — K760 Fatty (change of) liver, not elsewhere classified: Secondary | ICD-10-CM | POA: Diagnosis not present

## 2017-12-05 DIAGNOSIS — I10 Essential (primary) hypertension: Secondary | ICD-10-CM | POA: Diagnosis not present

## 2017-12-05 DIAGNOSIS — I2510 Atherosclerotic heart disease of native coronary artery without angina pectoris: Secondary | ICD-10-CM | POA: Diagnosis not present

## 2017-12-05 DIAGNOSIS — E279 Disorder of adrenal gland, unspecified: Secondary | ICD-10-CM | POA: Diagnosis not present

## 2017-12-05 DIAGNOSIS — R69 Illness, unspecified: Secondary | ICD-10-CM | POA: Diagnosis not present

## 2017-12-05 DIAGNOSIS — N400 Enlarged prostate without lower urinary tract symptoms: Secondary | ICD-10-CM | POA: Diagnosis not present

## 2017-12-05 DIAGNOSIS — R590 Localized enlarged lymph nodes: Secondary | ICD-10-CM | POA: Diagnosis not present

## 2017-12-05 DIAGNOSIS — C3411 Malignant neoplasm of upper lobe, right bronchus or lung: Secondary | ICD-10-CM | POA: Diagnosis not present

## 2017-12-05 DIAGNOSIS — J449 Chronic obstructive pulmonary disease, unspecified: Secondary | ICD-10-CM | POA: Diagnosis not present

## 2017-12-05 DIAGNOSIS — R432 Parageusia: Secondary | ICD-10-CM | POA: Diagnosis not present

## 2017-12-06 DIAGNOSIS — R51 Headache: Secondary | ICD-10-CM | POA: Diagnosis not present

## 2017-12-06 DIAGNOSIS — C7931 Secondary malignant neoplasm of brain: Secondary | ICD-10-CM | POA: Diagnosis not present

## 2017-12-06 DIAGNOSIS — C3411 Malignant neoplasm of upper lobe, right bronchus or lung: Secondary | ICD-10-CM | POA: Diagnosis not present

## 2017-12-06 DIAGNOSIS — R600 Localized edema: Secondary | ICD-10-CM | POA: Diagnosis not present

## 2017-12-07 ENCOUNTER — Ambulatory Visit (HOSPITAL_COMMUNITY): Payer: PPO

## 2017-12-07 DIAGNOSIS — C3411 Malignant neoplasm of upper lobe, right bronchus or lung: Secondary | ICD-10-CM | POA: Diagnosis not present

## 2017-12-07 DIAGNOSIS — C7931 Secondary malignant neoplasm of brain: Secondary | ICD-10-CM | POA: Diagnosis not present

## 2017-12-10 DIAGNOSIS — G939 Disorder of brain, unspecified: Secondary | ICD-10-CM | POA: Diagnosis not present

## 2017-12-10 DIAGNOSIS — C3490 Malignant neoplasm of unspecified part of unspecified bronchus or lung: Secondary | ICD-10-CM | POA: Diagnosis not present

## 2017-12-13 ENCOUNTER — Ambulatory Visit (HOSPITAL_COMMUNITY)
Admission: RE | Admit: 2017-12-13 | Discharge: 2017-12-13 | Disposition: A | Discharge status: Home / Self Care | Payer: Medicare HMO | Source: Ambulatory Visit | Attending: Oncology | Admitting: Oncology

## 2017-12-13 DIAGNOSIS — C3411 Malignant neoplasm of upper lobe, right bronchus or lung: Secondary | ICD-10-CM | POA: Diagnosis not present

## 2017-12-13 DIAGNOSIS — C3490 Malignant neoplasm of unspecified part of unspecified bronchus or lung: Secondary | ICD-10-CM | POA: Diagnosis not present

## 2017-12-13 DIAGNOSIS — I2510 Atherosclerotic heart disease of native coronary artery without angina pectoris: Secondary | ICD-10-CM | POA: Insufficient documentation

## 2017-12-13 DIAGNOSIS — I700 Atherosclerosis of aorta: Secondary | ICD-10-CM | POA: Diagnosis not present

## 2017-12-13 LAB — GLUCOSE, CAPILLARY: GLUCOSE-CAPILLARY: 119 mg/dL — AB (ref 65–99)

## 2017-12-13 MED ORDER — FLUDEOXYGLUCOSE F - 18 (FDG) INJECTION: 10.2000 | Freq: Once | INTRAVENOUS | Status: DC | PRN

## 2017-12-18 DIAGNOSIS — C7951 Secondary malignant neoplasm of bone: Secondary | ICD-10-CM | POA: Diagnosis not present

## 2017-12-18 DIAGNOSIS — C7931 Secondary malignant neoplasm of brain: Secondary | ICD-10-CM | POA: Diagnosis not present

## 2017-12-18 DIAGNOSIS — C3411 Malignant neoplasm of upper lobe, right bronchus or lung: Secondary | ICD-10-CM | POA: Diagnosis not present

## 2017-12-18 DIAGNOSIS — C7940 Secondary malignant neoplasm of unspecified part of nervous system: Secondary | ICD-10-CM | POA: Diagnosis not present

## 2017-12-19 DIAGNOSIS — R197 Diarrhea, unspecified: Secondary | ICD-10-CM | POA: Diagnosis not present

## 2017-12-19 DIAGNOSIS — T451X5A Adverse effect of antineoplastic and immunosuppressive drugs, initial encounter: Secondary | ICD-10-CM | POA: Diagnosis not present

## 2017-12-19 DIAGNOSIS — N400 Enlarged prostate without lower urinary tract symptoms: Secondary | ICD-10-CM | POA: Diagnosis not present

## 2017-12-19 DIAGNOSIS — R432 Parageusia: Secondary | ICD-10-CM | POA: Diagnosis not present

## 2017-12-19 DIAGNOSIS — R12 Heartburn: Secondary | ICD-10-CM | POA: Diagnosis not present

## 2017-12-19 DIAGNOSIS — Z72821 Inadequate sleep hygiene: Secondary | ICD-10-CM | POA: Diagnosis not present

## 2017-12-19 DIAGNOSIS — I10 Essential (primary) hypertension: Secondary | ICD-10-CM | POA: Diagnosis not present

## 2017-12-19 DIAGNOSIS — C3411 Malignant neoplasm of upper lobe, right bronchus or lung: Secondary | ICD-10-CM | POA: Diagnosis not present

## 2017-12-19 DIAGNOSIS — C7931 Secondary malignant neoplasm of brain: Secondary | ICD-10-CM | POA: Diagnosis not present

## 2017-12-19 DIAGNOSIS — I2510 Atherosclerotic heart disease of native coronary artery without angina pectoris: Secondary | ICD-10-CM | POA: Diagnosis not present

## 2017-12-21 DIAGNOSIS — C3411 Malignant neoplasm of upper lobe, right bronchus or lung: Secondary | ICD-10-CM | POA: Diagnosis not present

## 2017-12-21 DIAGNOSIS — Z5111 Encounter for antineoplastic chemotherapy: Secondary | ICD-10-CM | POA: Diagnosis not present

## 2017-12-21 DIAGNOSIS — Z79899 Other long term (current) drug therapy: Secondary | ICD-10-CM | POA: Diagnosis not present

## 2017-12-25 DIAGNOSIS — C7931 Secondary malignant neoplasm of brain: Secondary | ICD-10-CM | POA: Diagnosis not present

## 2017-12-25 DIAGNOSIS — C3411 Malignant neoplasm of upper lobe, right bronchus or lung: Secondary | ICD-10-CM | POA: Diagnosis not present

## 2017-12-26 DIAGNOSIS — C3411 Malignant neoplasm of upper lobe, right bronchus or lung: Secondary | ICD-10-CM | POA: Diagnosis not present

## 2017-12-26 DIAGNOSIS — E785 Hyperlipidemia, unspecified: Secondary | ICD-10-CM | POA: Diagnosis not present

## 2017-12-26 DIAGNOSIS — C7931 Secondary malignant neoplasm of brain: Secondary | ICD-10-CM | POA: Diagnosis not present

## 2017-12-26 DIAGNOSIS — K521 Toxic gastroenteritis and colitis: Secondary | ICD-10-CM | POA: Diagnosis not present

## 2017-12-26 DIAGNOSIS — T451X5A Adverse effect of antineoplastic and immunosuppressive drugs, initial encounter: Secondary | ICD-10-CM | POA: Diagnosis not present

## 2017-12-26 DIAGNOSIS — I2510 Atherosclerotic heart disease of native coronary artery without angina pectoris: Secondary | ICD-10-CM | POA: Diagnosis not present

## 2017-12-26 DIAGNOSIS — J9811 Atelectasis: Secondary | ICD-10-CM | POA: Diagnosis not present

## 2017-12-26 DIAGNOSIS — E278 Other specified disorders of adrenal gland: Secondary | ICD-10-CM | POA: Diagnosis not present

## 2017-12-26 DIAGNOSIS — I10 Essential (primary) hypertension: Secondary | ICD-10-CM | POA: Diagnosis not present

## 2017-12-26 DIAGNOSIS — Z7982 Long term (current) use of aspirin: Secondary | ICD-10-CM | POA: Diagnosis not present

## 2017-12-28 DIAGNOSIS — C3411 Malignant neoplasm of upper lobe, right bronchus or lung: Secondary | ICD-10-CM | POA: Diagnosis not present

## 2017-12-28 DIAGNOSIS — Z79899 Other long term (current) drug therapy: Secondary | ICD-10-CM | POA: Diagnosis not present

## 2017-12-28 DIAGNOSIS — Z5111 Encounter for antineoplastic chemotherapy: Secondary | ICD-10-CM | POA: Diagnosis not present

## 2017-12-30 DIAGNOSIS — J438 Other emphysema: Secondary | ICD-10-CM | POA: Diagnosis not present

## 2018-01-02 DIAGNOSIS — I2510 Atherosclerotic heart disease of native coronary artery without angina pectoris: Secondary | ICD-10-CM | POA: Diagnosis not present

## 2018-01-02 DIAGNOSIS — R0602 Shortness of breath: Secondary | ICD-10-CM | POA: Diagnosis not present

## 2018-01-02 DIAGNOSIS — R590 Localized enlarged lymph nodes: Secondary | ICD-10-CM | POA: Diagnosis not present

## 2018-01-02 DIAGNOSIS — T451X5D Adverse effect of antineoplastic and immunosuppressive drugs, subsequent encounter: Secondary | ICD-10-CM | POA: Diagnosis not present

## 2018-01-02 DIAGNOSIS — I10 Essential (primary) hypertension: Secondary | ICD-10-CM | POA: Diagnosis not present

## 2018-01-02 DIAGNOSIS — J449 Chronic obstructive pulmonary disease, unspecified: Secondary | ICD-10-CM | POA: Diagnosis not present

## 2018-01-02 DIAGNOSIS — E278 Other specified disorders of adrenal gland: Secondary | ICD-10-CM | POA: Diagnosis not present

## 2018-01-02 DIAGNOSIS — C7931 Secondary malignant neoplasm of brain: Secondary | ICD-10-CM | POA: Diagnosis not present

## 2018-01-02 DIAGNOSIS — C3411 Malignant neoplasm of upper lobe, right bronchus or lung: Secondary | ICD-10-CM | POA: Diagnosis not present

## 2018-01-02 DIAGNOSIS — J90 Pleural effusion, not elsewhere classified: Secondary | ICD-10-CM | POA: Diagnosis not present

## 2018-01-02 DIAGNOSIS — K521 Toxic gastroenteritis and colitis: Secondary | ICD-10-CM | POA: Diagnosis not present

## 2018-01-02 DIAGNOSIS — I700 Atherosclerosis of aorta: Secondary | ICD-10-CM | POA: Diagnosis not present

## 2018-01-08 DIAGNOSIS — J449 Chronic obstructive pulmonary disease, unspecified: Secondary | ICD-10-CM | POA: Diagnosis not present

## 2018-01-08 DIAGNOSIS — C7931 Secondary malignant neoplasm of brain: Secondary | ICD-10-CM | POA: Diagnosis not present

## 2018-01-08 DIAGNOSIS — R0602 Shortness of breath: Secondary | ICD-10-CM | POA: Diagnosis not present

## 2018-01-08 DIAGNOSIS — C3411 Malignant neoplasm of upper lobe, right bronchus or lung: Secondary | ICD-10-CM | POA: Diagnosis not present

## 2018-01-15 DIAGNOSIS — C3411 Malignant neoplasm of upper lobe, right bronchus or lung: Secondary | ICD-10-CM | POA: Diagnosis not present

## 2018-01-15 DIAGNOSIS — J449 Chronic obstructive pulmonary disease, unspecified: Secondary | ICD-10-CM | POA: Diagnosis not present

## 2018-01-15 DIAGNOSIS — Z7951 Long term (current) use of inhaled steroids: Secondary | ICD-10-CM | POA: Diagnosis not present

## 2018-01-15 DIAGNOSIS — Z87891 Personal history of nicotine dependence: Secondary | ICD-10-CM | POA: Diagnosis not present

## 2018-01-15 DIAGNOSIS — Z79899 Other long term (current) drug therapy: Secondary | ICD-10-CM | POA: Diagnosis not present

## 2018-01-15 DIAGNOSIS — E785 Hyperlipidemia, unspecified: Secondary | ICD-10-CM | POA: Diagnosis not present

## 2018-01-15 DIAGNOSIS — N400 Enlarged prostate without lower urinary tract symptoms: Secondary | ICD-10-CM | POA: Diagnosis not present

## 2018-01-15 DIAGNOSIS — C7931 Secondary malignant neoplasm of brain: Secondary | ICD-10-CM | POA: Diagnosis not present

## 2018-01-15 DIAGNOSIS — Z7982 Long term (current) use of aspirin: Secondary | ICD-10-CM | POA: Diagnosis not present

## 2018-01-15 DIAGNOSIS — I10 Essential (primary) hypertension: Secondary | ICD-10-CM | POA: Diagnosis not present

## 2018-01-17 DIAGNOSIS — C3411 Malignant neoplasm of upper lobe, right bronchus or lung: Secondary | ICD-10-CM | POA: Diagnosis not present

## 2018-01-17 DIAGNOSIS — C7931 Secondary malignant neoplasm of brain: Secondary | ICD-10-CM | POA: Diagnosis not present

## 2018-01-18 DIAGNOSIS — Z7951 Long term (current) use of inhaled steroids: Secondary | ICD-10-CM | POA: Diagnosis not present

## 2018-01-18 DIAGNOSIS — E785 Hyperlipidemia, unspecified: Secondary | ICD-10-CM | POA: Diagnosis not present

## 2018-01-18 DIAGNOSIS — Z87891 Personal history of nicotine dependence: Secondary | ICD-10-CM | POA: Diagnosis not present

## 2018-01-18 DIAGNOSIS — C7931 Secondary malignant neoplasm of brain: Secondary | ICD-10-CM | POA: Diagnosis not present

## 2018-01-18 DIAGNOSIS — Z79899 Other long term (current) drug therapy: Secondary | ICD-10-CM | POA: Diagnosis not present

## 2018-01-18 DIAGNOSIS — C3411 Malignant neoplasm of upper lobe, right bronchus or lung: Secondary | ICD-10-CM | POA: Diagnosis not present

## 2018-01-18 DIAGNOSIS — N400 Enlarged prostate without lower urinary tract symptoms: Secondary | ICD-10-CM | POA: Diagnosis not present

## 2018-01-18 DIAGNOSIS — J449 Chronic obstructive pulmonary disease, unspecified: Secondary | ICD-10-CM | POA: Diagnosis not present

## 2018-01-18 DIAGNOSIS — I10 Essential (primary) hypertension: Secondary | ICD-10-CM | POA: Diagnosis not present

## 2018-01-18 DIAGNOSIS — Z7982 Long term (current) use of aspirin: Secondary | ICD-10-CM | POA: Diagnosis not present

## 2018-01-21 DIAGNOSIS — I10 Essential (primary) hypertension: Secondary | ICD-10-CM | POA: Diagnosis not present

## 2018-01-21 DIAGNOSIS — Z7982 Long term (current) use of aspirin: Secondary | ICD-10-CM | POA: Diagnosis not present

## 2018-01-21 DIAGNOSIS — Z7951 Long term (current) use of inhaled steroids: Secondary | ICD-10-CM | POA: Diagnosis not present

## 2018-01-21 DIAGNOSIS — Z79899 Other long term (current) drug therapy: Secondary | ICD-10-CM | POA: Diagnosis not present

## 2018-01-21 DIAGNOSIS — C7931 Secondary malignant neoplasm of brain: Secondary | ICD-10-CM | POA: Diagnosis not present

## 2018-01-21 DIAGNOSIS — N400 Enlarged prostate without lower urinary tract symptoms: Secondary | ICD-10-CM | POA: Diagnosis not present

## 2018-01-21 DIAGNOSIS — E785 Hyperlipidemia, unspecified: Secondary | ICD-10-CM | POA: Diagnosis not present

## 2018-01-21 DIAGNOSIS — C3411 Malignant neoplasm of upper lobe, right bronchus or lung: Secondary | ICD-10-CM | POA: Diagnosis not present

## 2018-01-21 DIAGNOSIS — Z87891 Personal history of nicotine dependence: Secondary | ICD-10-CM | POA: Diagnosis not present

## 2018-01-21 DIAGNOSIS — J449 Chronic obstructive pulmonary disease, unspecified: Secondary | ICD-10-CM | POA: Diagnosis not present

## 2018-01-22 DIAGNOSIS — J42 Unspecified chronic bronchitis: Secondary | ICD-10-CM | POA: Diagnosis not present

## 2018-01-22 DIAGNOSIS — C3411 Malignant neoplasm of upper lobe, right bronchus or lung: Secondary | ICD-10-CM | POA: Diagnosis not present

## 2018-01-22 DIAGNOSIS — C7931 Secondary malignant neoplasm of brain: Secondary | ICD-10-CM | POA: Diagnosis not present

## 2018-01-24 DIAGNOSIS — C7931 Secondary malignant neoplasm of brain: Secondary | ICD-10-CM | POA: Diagnosis not present

## 2018-01-24 DIAGNOSIS — E785 Hyperlipidemia, unspecified: Secondary | ICD-10-CM | POA: Diagnosis not present

## 2018-01-24 DIAGNOSIS — C3411 Malignant neoplasm of upper lobe, right bronchus or lung: Secondary | ICD-10-CM | POA: Diagnosis not present

## 2018-01-24 DIAGNOSIS — J449 Chronic obstructive pulmonary disease, unspecified: Secondary | ICD-10-CM | POA: Diagnosis not present

## 2018-01-24 DIAGNOSIS — Z7951 Long term (current) use of inhaled steroids: Secondary | ICD-10-CM | POA: Diagnosis not present

## 2018-01-24 DIAGNOSIS — Z79899 Other long term (current) drug therapy: Secondary | ICD-10-CM | POA: Diagnosis not present

## 2018-01-24 DIAGNOSIS — Z87891 Personal history of nicotine dependence: Secondary | ICD-10-CM | POA: Diagnosis not present

## 2018-01-24 DIAGNOSIS — I10 Essential (primary) hypertension: Secondary | ICD-10-CM | POA: Diagnosis not present

## 2018-01-24 DIAGNOSIS — N400 Enlarged prostate without lower urinary tract symptoms: Secondary | ICD-10-CM | POA: Diagnosis not present

## 2018-01-24 DIAGNOSIS — Z7982 Long term (current) use of aspirin: Secondary | ICD-10-CM | POA: Diagnosis not present

## 2018-01-28 DIAGNOSIS — J449 Chronic obstructive pulmonary disease, unspecified: Secondary | ICD-10-CM | POA: Diagnosis not present

## 2018-01-28 DIAGNOSIS — C7931 Secondary malignant neoplasm of brain: Secondary | ICD-10-CM | POA: Diagnosis not present

## 2018-01-28 DIAGNOSIS — N400 Enlarged prostate without lower urinary tract symptoms: Secondary | ICD-10-CM | POA: Diagnosis not present

## 2018-01-28 DIAGNOSIS — E785 Hyperlipidemia, unspecified: Secondary | ICD-10-CM | POA: Diagnosis not present

## 2018-01-28 DIAGNOSIS — C3411 Malignant neoplasm of upper lobe, right bronchus or lung: Secondary | ICD-10-CM | POA: Diagnosis not present

## 2018-01-28 DIAGNOSIS — Z7951 Long term (current) use of inhaled steroids: Secondary | ICD-10-CM | POA: Diagnosis not present

## 2018-01-28 DIAGNOSIS — Z79899 Other long term (current) drug therapy: Secondary | ICD-10-CM | POA: Diagnosis not present

## 2018-01-28 DIAGNOSIS — Z7982 Long term (current) use of aspirin: Secondary | ICD-10-CM | POA: Diagnosis not present

## 2018-01-28 DIAGNOSIS — Z87891 Personal history of nicotine dependence: Secondary | ICD-10-CM | POA: Diagnosis not present

## 2018-01-28 DIAGNOSIS — I10 Essential (primary) hypertension: Secondary | ICD-10-CM | POA: Diagnosis not present

## 2018-01-30 DIAGNOSIS — J309 Allergic rhinitis, unspecified: Secondary | ICD-10-CM | POA: Diagnosis not present

## 2018-01-30 DIAGNOSIS — C3491 Malignant neoplasm of unspecified part of right bronchus or lung: Secondary | ICD-10-CM | POA: Diagnosis not present

## 2018-01-30 DIAGNOSIS — C7931 Secondary malignant neoplasm of brain: Secondary | ICD-10-CM | POA: Diagnosis not present

## 2018-01-30 DIAGNOSIS — J438 Other emphysema: Secondary | ICD-10-CM | POA: Diagnosis not present

## 2018-01-30 DIAGNOSIS — R269 Unspecified abnormalities of gait and mobility: Secondary | ICD-10-CM | POA: Diagnosis not present

## 2018-01-30 DIAGNOSIS — R69 Illness, unspecified: Secondary | ICD-10-CM | POA: Diagnosis not present

## 2018-01-30 DIAGNOSIS — E669 Obesity, unspecified: Secondary | ICD-10-CM | POA: Diagnosis not present

## 2018-01-30 DIAGNOSIS — K08109 Complete loss of teeth, unspecified cause, unspecified class: Secondary | ICD-10-CM | POA: Diagnosis not present

## 2018-01-30 DIAGNOSIS — I10 Essential (primary) hypertension: Secondary | ICD-10-CM | POA: Diagnosis not present

## 2018-01-30 DIAGNOSIS — C3492 Malignant neoplasm of unspecified part of left bronchus or lung: Secondary | ICD-10-CM | POA: Diagnosis not present

## 2018-02-05 DIAGNOSIS — E785 Hyperlipidemia, unspecified: Secondary | ICD-10-CM | POA: Diagnosis not present

## 2018-02-05 DIAGNOSIS — J449 Chronic obstructive pulmonary disease, unspecified: Secondary | ICD-10-CM | POA: Diagnosis not present

## 2018-02-05 DIAGNOSIS — C7931 Secondary malignant neoplasm of brain: Secondary | ICD-10-CM | POA: Diagnosis not present

## 2018-02-05 DIAGNOSIS — R590 Localized enlarged lymph nodes: Secondary | ICD-10-CM | POA: Diagnosis not present

## 2018-02-05 DIAGNOSIS — I10 Essential (primary) hypertension: Secondary | ICD-10-CM | POA: Diagnosis not present

## 2018-02-05 DIAGNOSIS — R918 Other nonspecific abnormal finding of lung field: Secondary | ICD-10-CM | POA: Diagnosis not present

## 2018-02-05 DIAGNOSIS — C7970 Secondary malignant neoplasm of unspecified adrenal gland: Secondary | ICD-10-CM | POA: Diagnosis not present

## 2018-02-05 DIAGNOSIS — J9811 Atelectasis: Secondary | ICD-10-CM | POA: Diagnosis not present

## 2018-02-05 DIAGNOSIS — C3411 Malignant neoplasm of upper lobe, right bronchus or lung: Secondary | ICD-10-CM | POA: Diagnosis not present

## 2018-02-05 DIAGNOSIS — Z9221 Personal history of antineoplastic chemotherapy: Secondary | ICD-10-CM | POA: Diagnosis not present

## 2018-02-07 DIAGNOSIS — C3411 Malignant neoplasm of upper lobe, right bronchus or lung: Secondary | ICD-10-CM | POA: Diagnosis not present

## 2018-02-07 DIAGNOSIS — R601 Generalized edema: Secondary | ICD-10-CM | POA: Diagnosis not present

## 2018-02-19 DIAGNOSIS — I2510 Atherosclerotic heart disease of native coronary artery without angina pectoris: Secondary | ICD-10-CM | POA: Diagnosis not present

## 2018-02-19 DIAGNOSIS — Z09 Encounter for follow-up examination after completed treatment for conditions other than malignant neoplasm: Secondary | ICD-10-CM | POA: Diagnosis not present

## 2018-02-19 DIAGNOSIS — I10 Essential (primary) hypertension: Secondary | ICD-10-CM | POA: Diagnosis not present

## 2018-02-19 DIAGNOSIS — C7931 Secondary malignant neoplasm of brain: Secondary | ICD-10-CM | POA: Diagnosis not present

## 2018-02-19 DIAGNOSIS — C3411 Malignant neoplasm of upper lobe, right bronchus or lung: Secondary | ICD-10-CM | POA: Diagnosis not present

## 2018-02-19 DIAGNOSIS — R590 Localized enlarged lymph nodes: Secondary | ICD-10-CM | POA: Diagnosis not present

## 2018-02-19 DIAGNOSIS — I959 Hypotension, unspecified: Secondary | ICD-10-CM | POA: Diagnosis not present

## 2018-02-19 DIAGNOSIS — K760 Fatty (change of) liver, not elsewhere classified: Secondary | ICD-10-CM | POA: Diagnosis not present

## 2018-02-19 DIAGNOSIS — I700 Atherosclerosis of aorta: Secondary | ICD-10-CM | POA: Diagnosis not present

## 2018-02-19 DIAGNOSIS — J449 Chronic obstructive pulmonary disease, unspecified: Secondary | ICD-10-CM | POA: Diagnosis not present

## 2018-02-19 DIAGNOSIS — R12 Heartburn: Secondary | ICD-10-CM | POA: Diagnosis not present

## 2018-02-19 DIAGNOSIS — Z9221 Personal history of antineoplastic chemotherapy: Secondary | ICD-10-CM | POA: Diagnosis not present

## 2018-02-21 DIAGNOSIS — Z5112 Encounter for antineoplastic immunotherapy: Secondary | ICD-10-CM | POA: Diagnosis not present

## 2018-02-21 DIAGNOSIS — Z79899 Other long term (current) drug therapy: Secondary | ICD-10-CM | POA: Diagnosis not present

## 2018-02-21 DIAGNOSIS — C3411 Malignant neoplasm of upper lobe, right bronchus or lung: Secondary | ICD-10-CM | POA: Diagnosis not present

## 2018-03-01 DIAGNOSIS — J438 Other emphysema: Secondary | ICD-10-CM | POA: Diagnosis not present

## 2018-03-06 DIAGNOSIS — C3411 Malignant neoplasm of upper lobe, right bronchus or lung: Secondary | ICD-10-CM | POA: Diagnosis not present

## 2018-03-06 DIAGNOSIS — J410 Simple chronic bronchitis: Secondary | ICD-10-CM | POA: Diagnosis not present

## 2018-03-06 DIAGNOSIS — Z09 Encounter for follow-up examination after completed treatment for conditions other than malignant neoplasm: Secondary | ICD-10-CM | POA: Diagnosis not present

## 2018-03-06 DIAGNOSIS — R601 Generalized edema: Secondary | ICD-10-CM | POA: Diagnosis not present

## 2018-03-06 DIAGNOSIS — R0600 Dyspnea, unspecified: Secondary | ICD-10-CM | POA: Diagnosis not present

## 2018-03-08 DIAGNOSIS — Z5112 Encounter for antineoplastic immunotherapy: Secondary | ICD-10-CM | POA: Diagnosis not present

## 2018-03-08 DIAGNOSIS — R601 Generalized edema: Secondary | ICD-10-CM | POA: Diagnosis not present

## 2018-03-08 DIAGNOSIS — C3411 Malignant neoplasm of upper lobe, right bronchus or lung: Secondary | ICD-10-CM | POA: Diagnosis not present

## 2018-03-11 DIAGNOSIS — I700 Atherosclerosis of aorta: Secondary | ICD-10-CM | POA: Diagnosis not present

## 2018-03-11 DIAGNOSIS — J90 Pleural effusion, not elsewhere classified: Secondary | ICD-10-CM | POA: Diagnosis not present

## 2018-03-11 DIAGNOSIS — J439 Emphysema, unspecified: Secondary | ICD-10-CM | POA: Diagnosis not present

## 2018-03-11 DIAGNOSIS — K7689 Other specified diseases of liver: Secondary | ICD-10-CM | POA: Diagnosis not present

## 2018-03-11 DIAGNOSIS — R590 Localized enlarged lymph nodes: Secondary | ICD-10-CM | POA: Diagnosis not present

## 2018-03-11 DIAGNOSIS — C7971 Secondary malignant neoplasm of right adrenal gland: Secondary | ICD-10-CM | POA: Diagnosis not present

## 2018-03-11 DIAGNOSIS — C3411 Malignant neoplasm of upper lobe, right bronchus or lung: Secondary | ICD-10-CM | POA: Diagnosis not present

## 2018-03-11 DIAGNOSIS — N281 Cyst of kidney, acquired: Secondary | ICD-10-CM | POA: Diagnosis not present

## 2018-03-11 DIAGNOSIS — I2510 Atherosclerotic heart disease of native coronary artery without angina pectoris: Secondary | ICD-10-CM | POA: Diagnosis not present

## 2018-03-14 DIAGNOSIS — C7931 Secondary malignant neoplasm of brain: Secondary | ICD-10-CM | POA: Diagnosis not present

## 2018-03-14 DIAGNOSIS — C3411 Malignant neoplasm of upper lobe, right bronchus or lung: Secondary | ICD-10-CM | POA: Diagnosis not present

## 2018-03-14 DIAGNOSIS — Z09 Encounter for follow-up examination after completed treatment for conditions other than malignant neoplasm: Secondary | ICD-10-CM | POA: Diagnosis not present

## 2018-03-22 DIAGNOSIS — J948 Other specified pleural conditions: Secondary | ICD-10-CM | POA: Diagnosis not present

## 2018-03-22 DIAGNOSIS — J90 Pleural effusion, not elsewhere classified: Secondary | ICD-10-CM | POA: Diagnosis not present

## 2018-03-22 DIAGNOSIS — C3411 Malignant neoplasm of upper lobe, right bronchus or lung: Secondary | ICD-10-CM | POA: Diagnosis not present

## 2018-04-01 DIAGNOSIS — J438 Other emphysema: Secondary | ICD-10-CM | POA: Diagnosis not present

## 2018-04-02 DIAGNOSIS — J9819 Other pulmonary collapse: Secondary | ICD-10-CM | POA: Diagnosis not present

## 2018-04-02 DIAGNOSIS — J984 Other disorders of lung: Secondary | ICD-10-CM | POA: Diagnosis not present

## 2018-04-02 DIAGNOSIS — J90 Pleural effusion, not elsewhere classified: Secondary | ICD-10-CM | POA: Diagnosis not present

## 2018-04-02 DIAGNOSIS — R0602 Shortness of breath: Secondary | ICD-10-CM | POA: Diagnosis not present

## 2018-04-02 DIAGNOSIS — R0600 Dyspnea, unspecified: Secondary | ICD-10-CM | POA: Diagnosis not present

## 2018-04-04 DIAGNOSIS — C7931 Secondary malignant neoplasm of brain: Secondary | ICD-10-CM | POA: Diagnosis not present

## 2018-04-04 DIAGNOSIS — J90 Pleural effusion, not elsewhere classified: Secondary | ICD-10-CM | POA: Diagnosis not present

## 2018-04-04 DIAGNOSIS — C3411 Malignant neoplasm of upper lobe, right bronchus or lung: Secondary | ICD-10-CM | POA: Diagnosis not present

## 2018-04-04 DIAGNOSIS — Z09 Encounter for follow-up examination after completed treatment for conditions other than malignant neoplasm: Secondary | ICD-10-CM | POA: Diagnosis not present

## 2018-04-19 DIAGNOSIS — Z4682 Encounter for fitting and adjustment of non-vascular catheter: Secondary | ICD-10-CM | POA: Diagnosis not present

## 2018-04-19 DIAGNOSIS — Z452 Encounter for adjustment and management of vascular access device: Secondary | ICD-10-CM | POA: Diagnosis not present

## 2018-04-19 DIAGNOSIS — R918 Other nonspecific abnormal finding of lung field: Secondary | ICD-10-CM | POA: Diagnosis not present

## 2018-04-19 DIAGNOSIS — J90 Pleural effusion, not elsewhere classified: Secondary | ICD-10-CM | POA: Diagnosis not present

## 2018-06-25 ENCOUNTER — Other Ambulatory Visit: Payer: Self-pay | Admitting: Physician Assistant

## 2018-06-25 DIAGNOSIS — I10 Essential (primary) hypertension: Secondary | ICD-10-CM

## 2018-06-26 NOTE — Telephone Encounter (Signed)
lmtcb and schedule apt

## 2018-06-26 NOTE — Telephone Encounter (Signed)
Last seen 09/21/17  [facility]  Needs to be seen

## 2018-08-12 DIAGNOSIS — R69 Illness, unspecified: Secondary | ICD-10-CM | POA: Diagnosis not present

## 2018-09-27 ENCOUNTER — Ambulatory Visit (INDEPENDENT_AMBULATORY_CARE_PROVIDER_SITE_OTHER): Payer: Medicare Other | Admitting: Physician Assistant

## 2018-09-27 ENCOUNTER — Encounter: Payer: Self-pay | Admitting: Physician Assistant

## 2018-09-27 VITALS — BP 107/70 | HR 64 | Temp 98.6°F | Ht 66.0 in | Wt 223.8 lb

## 2018-09-27 DIAGNOSIS — I10 Essential (primary) hypertension: Secondary | ICD-10-CM | POA: Diagnosis not present

## 2018-09-27 DIAGNOSIS — J40 Bronchitis, not specified as acute or chronic: Secondary | ICD-10-CM

## 2018-09-27 DIAGNOSIS — C3411 Malignant neoplasm of upper lobe, right bronchus or lung: Secondary | ICD-10-CM

## 2018-09-27 MED ORDER — LOSARTAN POTASSIUM 100 MG PO TABS: 100.0000 mg | ORAL_TABLET | Freq: Every day | ORAL | 3 refills | Status: AC

## 2018-09-27 MED ORDER — METHYLPREDNISOLONE ACETATE 80 MG/ML IJ SUSP
80.0000 mg | Freq: Once | INTRAMUSCULAR | Status: AC
  Administered 2018-09-27: 80 mg via INTRAMUSCULAR

## 2018-09-27 MED ORDER — METOPROLOL SUCCINATE ER 200 MG PO TB24: 200.0000 mg | ORAL_TABLET | Freq: Every day | ORAL | 3 refills | Status: AC

## 2018-09-27 MED ORDER — AZITHROMYCIN 250 MG PO TABS: ORAL_TABLET | ORAL | 0 refills | Status: AC

## 2018-09-27 MED ORDER — AMLODIPINE BESYLATE 10 MG PO TABS: 10.0000 mg | ORAL_TABLET | Freq: Every day | ORAL | 3 refills | Status: AC

## 2018-09-29 NOTE — Progress Notes (Signed)
BP 107/70   Pulse 64   Temp 98.6 F (37 C) (Oral)   Ht 5\' 6"  (1.676 m)   Wt 223 lb 12.8 oz (101.5 kg)   BMI 36.12 kg/m    Subjective:    Patient ID: Dustin Bradshaw, male    DOB: [birth year], 77 y.o.   MRN: [id number]  HPI: Dustin Bradshaw is a 77 y.o. male presenting on 09/27/2018 for Hypertension  This patient comes in for periodic recheck on medications and conditions including lung cancer, terminal, hypertension.  He is under the chronic care with hospice.  Some days he has very good days some days he is extremely short of breath.  He is in the high room still.  Nurse will come out once or twice a week as needed.  They are covering all of his medicines.  He does need a refill on his blood pressure medicine with [place].  Patient with several days of progressing upper respiratory and bronchial symptoms. Initially there was more upper respiratory congestion. This progressed to having significant cough that is productive throughout the day and severe at night. There is occasional wheezing after coughing. Sometimes there is slight dyspnea on exertion. It is productive mucus that is yellow in color. Denies any blood.   All medications are reviewed today. There are no reports of any problems with the medications. All of the medical conditions are reviewed and updated.  Lab work is reviewed and will be ordered as medically necessary. There are no new problems reported with today's visit.   Past Medical History:  Diagnosis Date  . High cholesterol   . Hypertension   . Pneumonia   . Primary cancer of right upper lobe of lung ([place]) 10/06/2015  . Shortness of breath dyspnea    Relevant past medical, surgical, family and social history reviewed and updated as indicated. Interim medical history since our last visit reviewed. Allergies and medications reviewed and updated. DATA REVIEWED: CHART IN EPIC  Family History reviewed for pertinent findings.  Review of Systems  Constitutional: Positive  for fatigue. Negative for appetite change.  HENT: Positive for sinus pressure and sore throat.   Eyes: Negative.  Negative for pain and visual disturbance.  Respiratory: Positive for wheezing. Negative for cough, chest tightness and shortness of breath.   Cardiovascular: Negative.  Negative for chest pain, palpitations and leg swelling.  Gastrointestinal: Negative.  Negative for abdominal pain, diarrhea, nausea and vomiting.  Endocrine: Negative.   Genitourinary: Negative.   Musculoskeletal: Positive for back pain and myalgias.  Skin: Negative.  Negative for color change and rash.  Neurological: Positive for headaches. Negative for weakness and numbness.  Psychiatric/Behavioral: Negative.     Allergies as of 09/27/2018   No Known Allergies     Medication List       Accurate as of September 27, 2018 11:59 PM. Always use your most recent med list.        amLODipine 10 MG tablet Commonly known as:  NORVASC Take 1 tablet (10 mg total) by mouth daily.   azithromycin 250 MG tablet Commonly known as:  ZITHROMAX Z-PAK Take as directed   LORazepam 1 MG tablet Commonly known as:  ATIVAN Take 1 tablet (1 mg total) by mouth as needed.   losartan 100 MG tablet Commonly known as:  COZAAR Take 1 tablet (100 mg total) by mouth daily.   metoprolol 200 MG 24 hr tablet Commonly known as:  TOPROL-XL Take 1 tablet (200 mg total) by mouth  daily.   predniSONE 20 MG tablet Commonly known as:  DELTASONE Take 3 tabs daily for 1 week, then 2 tabs daily for week 2, then 1 tab daily for week 3.          Objective:    BP 107/70   Pulse 64   Temp 98.6 F (37 C) (Oral)   Ht 5\' 6"  (1.676 m)   Wt 223 lb 12.8 oz (101.5 kg)   BMI 36.12 kg/m   No Known Allergies  Wt Readings from Last 3 Encounters:  09/27/18 223 lb 12.8 oz (101.5 kg)  09/21/17 210 lb 12.8 oz (95.6 kg)  08/18/17 211 lb (95.7 kg)    Physical Exam Constitutional:      Appearance: He is well-developed.  HENT:      Head: Normocephalic and atraumatic.     Right Ear: Hearing and tympanic membrane normal.     Left Ear: Hearing and tympanic membrane normal.     Nose: Mucosal edema present. No nasal deformity.     Right Sinus: Frontal sinus tenderness present.     Left Sinus: Frontal sinus tenderness present.     Mouth/Throat:     Pharynx: Posterior oropharyngeal erythema present.  Eyes:     General:        Right eye: No discharge.        Left eye: No discharge.     Conjunctiva/sclera: Conjunctivae normal.     Pupils: Pupils are equal, round, and reactive to light.  Neck:     Musculoskeletal: Normal range of motion and neck supple.  Cardiovascular:     Rate and Rhythm: Normal rate and regular rhythm.     Heart sounds: Normal heart sounds.  Pulmonary:     Effort: Pulmonary effort is normal. No respiratory distress.     Breath sounds: Wheezing present. No decreased breath sounds, rhonchi or rales.  Abdominal:     General: Bowel sounds are normal.     Palpations: Abdomen is soft.  Musculoskeletal: Normal range of motion.  Skin:    General: Skin is warm and dry.     Results for orders placed or performed during the hospital encounter of 12/13/17  Glucose, capillary  Result Value Ref Range   Glucose-Capillary 119 (H) 65 - 99 mg/dL      Assessment & Plan:   1. Essential hypertension - amLODipine (NORVASC) 10 MG tablet; Take 1 tablet (10 mg total) by mouth daily.  Dispense: 90 tablet; Refill: 3 - losartan (COZAAR) 100 MG tablet; Take 1 tablet (100 mg total) by mouth daily.  Dispense: 90 tablet; Refill: 3 - metoprolol (TOPROL-XL) 200 MG 24 hr tablet; Take 1 tablet (200 mg total) by mouth daily.  Dispense: 90 tablet; Refill: 3  2. Bronchitis - azithromycin (ZITHROMAX Z-PAK) 250 MG tablet; Take as directed  Dispense: 6 each; Refill: 0 - methylPREDNISolone acetate (DEPO-MEDROL) injection 80 mg  3. Primary cancer of right upper lobe of lung (HCC) - methylPREDNISolone acetate (DEPO-MEDROL)  injection 80 mg   Continue all other maintenance medications as listed above.  Follow up plan: No follow-ups on file.  Educational handout given for [place] PA-C [facility] [address]  [place], [place] [address] [phone]   09/29/2018, 9:36 PM

## 2018-12-11 ENCOUNTER — Telehealth: Payer: Self-pay | Admitting: Physician Assistant

## 2018-12-11 NOTE — Telephone Encounter (Signed)
Aware.  Continue as prescribed.

## 2019-01-08 DEATH — deceased
